# Patient Record
Sex: Female | Born: 1995
Health system: Southern US, Community
[De-identification: ages and names within clinical notes are randomized; demographics above are authoritative.]

## PROBLEM LIST (undated history)

## (undated) DIAGNOSIS — G8250 Quadriplegia, unspecified: Secondary | ICD-10-CM

## (undated) DIAGNOSIS — R569 Unspecified convulsions: Secondary | ICD-10-CM

## (undated) HISTORY — DX: Unspecified convulsions: R56.9

## (undated) HISTORY — PX: MYRINGOTOMY WITH TUBE PLACEMENT: SHX5663

---
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
## 2016-07-01 ENCOUNTER — Telehealth: Payer: Self-pay

## 2016-07-01 DIAGNOSIS — G40309 Generalized idiopathic epilepsy and epileptic syndromes, not intractable, without status epilepticus: Secondary | ICD-10-CM

## 2016-07-01 MED ORDER — CARBATROL 200 MG PO CP12: 200.0000 mg | ORAL_CAPSULE | Freq: Every evening | ORAL | Status: DC

## 2016-07-01 NOTE — Telephone Encounter (Signed)
Signed elecronically.

## 2016-07-01 NOTE — Telephone Encounter (Signed)
[facility] sent over a refill request for Carbatrol 200mg .  CB:[phone]

## 2016-07-05 ENCOUNTER — Other Ambulatory Visit: Payer: Self-pay | Admitting: Family

## 2016-07-05 DIAGNOSIS — G40309 Generalized idiopathic epilepsy and epileptic syndromes, not intractable, without status epilepticus: Secondary | ICD-10-CM

## 2016-07-05 MED ORDER — CARBATROL 300 MG PO CP12: 300.0000 mg | ORAL_CAPSULE | Freq: Every morning | ORAL | Status: DC

## 2016-08-29 ENCOUNTER — Other Ambulatory Visit: Payer: Self-pay | Admitting: Family

## 2016-11-01 ENCOUNTER — Other Ambulatory Visit (INDEPENDENT_AMBULATORY_CARE_PROVIDER_SITE_OTHER): Payer: Self-pay | Admitting: Family

## 2016-11-01 DIAGNOSIS — R451 Restlessness and agitation: Secondary | ICD-10-CM

## 2016-11-01 MED ORDER — CLONAZEPAM 2 MG PO TABS: ORAL_TABLET | ORAL | 1 refills | Status: DC

## 2017-01-06 ENCOUNTER — Other Ambulatory Visit (INDEPENDENT_AMBULATORY_CARE_PROVIDER_SITE_OTHER): Payer: Self-pay | Admitting: Family

## 2017-01-06 DIAGNOSIS — G40309 Generalized idiopathic epilepsy and epileptic syndromes, not intractable, without status epilepticus: Secondary | ICD-10-CM

## 2017-01-06 MED ORDER — CARBATROL 200 MG PO CP12: 200.0000 mg | ORAL_CAPSULE | Freq: Every evening | ORAL | 0 refills | Status: DC

## 2017-01-25 ENCOUNTER — Encounter (INDEPENDENT_AMBULATORY_CARE_PROVIDER_SITE_OTHER): Payer: Self-pay | Admitting: Family

## 2017-01-25 ENCOUNTER — Telehealth (INDEPENDENT_AMBULATORY_CARE_PROVIDER_SITE_OTHER): Payer: Self-pay

## 2017-01-25 DIAGNOSIS — G40309 Generalized idiopathic epilepsy and epileptic syndromes, not intractable, without status epilepticus: Secondary | ICD-10-CM

## 2017-01-25 MED ORDER — CARBATROL 300 MG PO CP12: 300.0000 mg | ORAL_CAPSULE | Freq: Every morning | ORAL | 0 refills | Status: DC

## 2017-01-25 NOTE — Telephone Encounter (Signed)
[facility] faxed over a refill request for Carbatrol 300mg    [phone]

## 2017-02-06 ENCOUNTER — Encounter (INDEPENDENT_AMBULATORY_CARE_PROVIDER_SITE_OTHER): Payer: Self-pay | Admitting: Family

## 2017-02-06 NOTE — Progress Notes (Signed)
Patient: Ebony JewelsJasmin Young MRN: [id number] Sex: female DOB: November 25, 1996  Provider: Elveria Risingina Zahki Hoogendoorn, NP Location of Care: [facility]  Note type: Routine return visit  History of Present Illness: Referral Source: Harrison MonsWilliam Brad Davis, MD [facility] and [facility] Chief Complaint: Generalized convulsive epilepsy  [name] is a 21 y.o. with history of seizures and quadriparesis. She was last seen December 17, 2015. Ebony Young has moderately severe cognitive impairment, spastic quadriparesis, and well controlled seizures that occurred as a result of the neonatal insult, which was presumed to be hypoxic ischemic. She is taking and tolerating Carbatrol for her seizure disorder. Roxie had an EEG on October 28, 2014 to determine if she could safely taper off Carbatrol since she had been seizure free for more than 5 years. The EEG was abnormal, with very low wattage beta range activity and rhythmic delta range activity that could be artifactual. No seizure activity was seen. The decision was made for her to continue taking Carbatrol.   Today [name]'s mother reports that she has remained seizure free since her last visit. She has been generally healthy. She has episodes of agitation at night, and Clonazepam helps when comfort measures fail to calm her. Gladyse has oral seeking behavior and wears arm splints to prevent her from getting her hands in her mouth. She manages to chew on the straps of the arm splints. 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   Her mother has no other health concerns for Ebony Young today other than previously mentioned.  Review of Systems: Please see the HPI for neurologic and other pertinent review of systems. Otherwise, the following systems are noncontributory including constitutional, eyes, ears, nose and throat, cardiovascular, respiratory, gastrointestinal,  genitourinary, musculoskeletal, skin, endocrine, hematologic/lymph, allergic/immunologic and psychiatric.   Past Medical History:  Diagnosis Date  . Seizures (HCC)    Hospitalizations: No., Head Injury: No., Nervous System Infections: No., Immunizations up to date: Yes.    Past Medical History Comments: The patient had seizures in infancy. They subsided only to recur in 546 months of age. Over time it was clear that the patient had left greater than right hemiparesis. She was profoundly cognitively impaired. Her seizures were relatively infrequent on the order of 1-2 per year. Her last seizure was October 2008. She has taken tolerated medications well.  The patient has problems with insomnia when she awakens she watches TV. She does not cry until she hears others awaken.  The patient has problems with weight gain. She has also had problems with self mutilatory behavior which has been solved by placing her arms and splints.  Her last MRI scan showed evidence of generalized atrophy and hydrocephalus ex vacuo. EEGs in the past showed diffuse slowing and right frontal spikes during sleep. She had an EEG July 03, 2006 that was an essentially normal record awake and drowsy. She had a repeat EEG on October 28, 2014 that was abnormal as mentioned. She has not had a polysomnogram.  Surgical History Past Surgical History:  Procedure Laterality Date  . MYRINGOTOMY WITH TUBE PLACEMENT      Family History family history includes Cancer in her paternal grandmother; Pneumonia in her paternal grandfather. Family History is otherwise negative for migraines, seizures, cognitive impairment, blindness, deafness, birth defects, chromosomal disorder, autism.  Social History Social History   Social History  . Marital status: Single    Spouse name: N/A  .  Number of children: N/A  . Years of education: N/A   Social History Main Topics  . Smoking status: Never Smoker  . Smokeless tobacco: Never Used  .  Alcohol use No  . Drug use: No  . Sexual activity: No   Other Topics Concern  . 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 She lives with her parents. She enjoys watching TV and car rides.    Allergies No Known Allergies  Physical Exam BP 110/70   Pulse 90   LMP 01/30/2017 (Within Days)       General: alert, short stature, well nourished, in no acute distress, non-handed       Head: microcephalic, no dysmorphic features       Ears, Nose and Throat: Otoscopic: tympanic membranes normal . Pharynx: Cannot see.      Neck: supple, full range of motion, no cranial or cervical bruits      Respiratory: auscultation clear      Cardiovascular: no murmurs, pulses are normal      Musculoskeletal: no skeletal deformities or apparent scoliosis      Skin: no rashes or neurocutaneous lesions, She has scars on her hands from previous self-inflicted injuries.She wears braces on her arms to keep her from biting them.       Neurologic Exam      Mental Status: alert; she is unable to cooperate and follow commands. She has no language. She laughs aloud at times.    Cranial Nerves: visual fields are full to double simultaneous stimuli; extraocular movements are full and dysconjugate; pupils are round reactive to light; funduscopic examination shows positive red reflex    bilaterally; symmetric with lower facial weakness ; midline tongue, I am unable to see the uvula; the patient turns to localize sound bilaterally.     Motor: spastic quadriparesis general strength in the 4 / 5 range, weaker on the left, with the right leg externally rotated.The patient immediately pulled at her clothing and put her fingers in her mouth when    splints were removed from her arms. She picked up objects of interest and then quickly threw them.     Sensory: Withdrawal x4     Coordination: unable to test     Gait and Station: The patient is wheelchair-bound. She is plegic. She can bear weight  on her legs and stands independently with support, but only for a few seconds. She is not able to walk independently. She was able to walk leaning forward holding onto her father, with a broad-based gait, dragging her feet.  Reflexes: symmetric and normal proximally, diminished distally bilaterally; no clonus; bilateral flexor plantar responses.   Impression  1. Generalized convulsive epilepsy  2. Congenital quadriplegia  3. Severe developmental delay  4. Microcephaly  5. Pervasive developmental disorder  6. Insomnia   Recommendations for plan of care The patient's previous [facility] records were reviewed. Zori has neither had nor required imaging or lab studies since the last visit. She is a 21 year old girl with history of generalized convulsive epilepsy in good control,congenital quadriplegia, microcephaly, pervasive developmental disorder, and insomnia. She has remained seizure free, but because of the abnormal EEG in November 2015, will remain on Carbatrol for now.  I will see her back in follow up in 1 year or sooner if needed. Her mother agreed with these plans.  The medication list was reviewed and reconciled.  No changes were made in the prescribed medications today.  A complete medication list was provided to the patient/caregiver.  Allergies as of 02/07/2017   No Known Allergies     Medication List       Accurate as of 02/07/17  4:04 PM. Always use your most recent med list.          CARBATROL 300 MG 12 hr capsule Generic drug:  carbamazepine Take 1 capsule (300 mg total) by mouth every morning.   CARBATROL 200 MG 12 hr capsule Generic drug:  carbamazepine Take 1 capsule (200 mg total) by mouth every evening.   clonazePAM 2 MG tablet Commonly known as:  KLONOPIN Give 1 tablet at bedtime as needed for agitation. May repeat 1/2 tablet in 2 hours if needed.   ranitidine 75 MG/5ML syrup Commonly known as:  ZANTAC       Dr. Sharene Skeans was consulted regarding the  patient.   Total time spent with the patient was 25 minutes, of which 50% or more was spent in counseling and coordination of care.   Elveria Rising NP-C

## 2017-02-07 ENCOUNTER — Ambulatory Visit (INDEPENDENT_AMBULATORY_CARE_PROVIDER_SITE_OTHER): Payer: Medicaid Other | Admitting: Family

## 2017-02-07 ENCOUNTER — Encounter (INDEPENDENT_AMBULATORY_CARE_PROVIDER_SITE_OTHER): Payer: Self-pay | Admitting: Family

## 2017-02-07 VITALS — BP 110/70 | HR 90

## 2017-02-07 DIAGNOSIS — R6250 Unspecified lack of expected normal physiological development in childhood: Secondary | ICD-10-CM

## 2017-02-07 DIAGNOSIS — Q02 Microcephaly: Secondary | ICD-10-CM | POA: Diagnosis not present

## 2017-02-07 DIAGNOSIS — G40309 Generalized idiopathic epilepsy and epileptic syndromes, not intractable, without status epilepticus: Secondary | ICD-10-CM

## 2017-02-07 DIAGNOSIS — G808 Other cerebral palsy: Secondary | ICD-10-CM

## 2017-02-07 DIAGNOSIS — R451 Restlessness and agitation: Secondary | ICD-10-CM

## 2017-02-07 MED ORDER — CARBATROL 200 MG PO CP12: 200.0000 mg | ORAL_CAPSULE | Freq: Every evening | ORAL | 5 refills | Status: DC

## 2017-02-07 MED ORDER — CARBATROL 300 MG PO CP12: 300.0000 mg | ORAL_CAPSULE | Freq: Every morning | ORAL | 5 refills | Status: DC

## 2017-02-07 MED ORDER — CLONAZEPAM 2 MG PO TABS: ORAL_TABLET | ORAL | 5 refills | Status: DC

## 2017-02-07 NOTE — Patient Instructions (Signed)
Continue Ebony Young's medications as you have been giving them. Let me know if she has any break through seizures.   Please sign up for MyChart - your online portal to Ebony Young's electronic medical record.   Please plan to return for follow up in 1 year or sooner if needed.

## 2017-02-21 ENCOUNTER — Other Ambulatory Visit (INDEPENDENT_AMBULATORY_CARE_PROVIDER_SITE_OTHER): Payer: Self-pay | Admitting: Family

## 2017-02-21 DIAGNOSIS — G40309 Generalized idiopathic epilepsy and epileptic syndromes, not intractable, without status epilepticus: Secondary | ICD-10-CM

## 2017-02-28 ENCOUNTER — Emergency Department (HOSPITAL_COMMUNITY)
Admission: EM | Admit: 2017-02-28 | Discharge: 2017-03-01 | Disposition: A | Discharge status: Home / Self Care | Payer: Managed Care, Other (non HMO) | Attending: Emergency Medicine | Admitting: Emergency Medicine

## 2017-02-28 ENCOUNTER — Encounter (HOSPITAL_COMMUNITY): Payer: Self-pay | Admitting: *Deleted

## 2017-02-28 DIAGNOSIS — Y999 Unspecified external cause status: Secondary | ICD-10-CM | POA: Insufficient documentation

## 2017-02-28 DIAGNOSIS — Y9389 Activity, other specified: Secondary | ICD-10-CM | POA: Diagnosis not present

## 2017-02-28 DIAGNOSIS — W06XXXA Fall from bed, initial encounter: Secondary | ICD-10-CM | POA: Insufficient documentation

## 2017-02-28 DIAGNOSIS — Y92008 Other place in unspecified non-institutional (private) residence as the place of occurrence of the external cause: Secondary | ICD-10-CM | POA: Insufficient documentation

## 2017-02-28 DIAGNOSIS — W19XXXA Unspecified fall, initial encounter: Secondary | ICD-10-CM

## 2017-02-28 DIAGNOSIS — S0003XA Contusion of scalp, initial encounter: Secondary | ICD-10-CM | POA: Insufficient documentation

## 2017-02-28 DIAGNOSIS — S0990XA Unspecified injury of head, initial encounter: Secondary | ICD-10-CM | POA: Diagnosis present

## 2017-02-28 HISTORY — DX: Quadriplegia, unspecified: G82.50

## 2017-02-28 MED ORDER — LORAZEPAM 2 MG/ML IJ SOLN
2.0000 mg | Freq: Once | INTRAMUSCULAR | Status: AC
  Administered 2017-02-28: 2 mg via INTRAMUSCULAR
  Filled 2017-02-28: qty 1

## 2017-02-28 NOTE — ED Triage Notes (Signed)
Pt is wheelchair bound and was bouncing at home, fell off the bed onto the floor. Hematoma noted to forehead

## 2017-02-28 NOTE — ED Triage Notes (Signed)
Family requesting CT scan of Head.

## 2017-02-28 NOTE — ED Notes (Signed)
Pt called x 2 with no answer  

## 2017-02-28 NOTE — ED Provider Notes (Signed)
[facility]-EMERGENCY DEPT Provider Note   CSN: [id number] Arrival date & time: 02/28/17  2022  By signing my name below, I, Majel Homereyton Lee, attest that this documentation has been prepared under the direction and in the presence of Terance HartKelly Gekas, PA-C . Electronically Signed: Majel HomerPeyton Lee, Scribe. 02/28/2017. 10:26 PM.  History   Chief Complaint Chief Complaint  Patient presents with  . Fall   The history is provided by a parent. No language interpreter was used.   HPI Comments: Ebony Young is a 21 y.o. female with PMHx of quadriparesis and seizures, who presents to the Emergency Department for an evaluation s/p a fall that occurred at ~11:00 AM this morning. Per mom, "pt likes to bounce" and was bouncing on her bed this evening when she suddenly fell off the bed, causing her to strike her head on the floor. She states she was getting ready to give pt a bath this evening when she noticed a large "bump" to her central forehead that was "soft, not hard," which is why she brought her to the ED. Pt's mom denies any recent changes in pt's activity, bleeding anywhere, recent seizure-like activity, and use of anticoagulant medication.   Past Medical History:  Diagnosis Date  . Quadriparesis (HCC)   . Seizures [facility](HCC)    Patient Active Problem List   Diagnosis Date Noted  . Agitation 12/17/2015  . Generalized convulsive epilepsy (HCC) 06/13/2013  . Congenital quadriplegia (HCC) 06/13/2013  . Microcephalus (HCC) 06/13/2013  . Severe developmental delay 06/13/2013   Past Surgical History:  Procedure Laterality Date  . MYRINGOTOMY WITH TUBE PLACEMENT     OB History    No data available     Home Medications    Prior to Admission medications   Medication Sig Start Date End Date Taking? Authorizing Provider  CARBATROL 200 MG 12 hr capsule Take 1 capsule (200 mg total) by mouth every evening. 02/07/17   Elveria Risingina Goodpasture, NP  CARBATROL 300 MG 12 hr capsule TAKE ONE CAPSULE BY MOUTH EVERY DAY 02/21/17    Elveria Risingina Goodpasture, NP  clonazePAM (KLONOPIN) 2 MG tablet Give 1 tablet at bedtime as needed for agitation. May repeat 1/2 tablet in 2 hours if needed. 02/07/17   Elveria Risingina Goodpasture, NP  ranitidine (ZANTAC) 75 MG/5ML syrup  05/28/13   Historical Provider, MD   Family History Family History  Problem Relation Age of Onset  . Cancer Paternal Grandmother     Died at 7260  . Pneumonia Paternal Grandfather     Died at 2476   Social History Social History  Substance Use Topics  . Smoking status: Never Smoker  . Smokeless tobacco: Never Used  . Alcohol use No   Allergies   Patient has no known allergies.  Review of Systems Review of Systems  Constitutional: Negative for fever.  Skin:       +bump to her forehead   Physical Exam Updated Vital Signs BP 136/87 (BP Location: Left Arm)   Pulse 118   Temp 98.5 F (36.9 C) (Axillary)   Resp 20   Ht 4\' 9"  (1.448 m)   Wt 105 lb (47.6 kg)   LMP 01/30/2017 (Within Days)   SpO2 100%   BMI 22.72 kg/m   Physical Exam  Constitutional: She is oriented to person, place, and time. She appears well-developed and well-nourished. No distress.  Non-verbal at baseline  HENT:  Head: Normocephalic.  Medium sized hematoma over center of forehead. No palpable skull fracture.   Eyes: EOM are normal.  Neck: Normal range of motion.  Cardiovascular: Tachycardia present.   Pulmonary/Chest: Effort normal.  Abdominal: She exhibits no distension.  Musculoskeletal: Normal range of motion.  Neurological: She is alert and oriented to person, place, and time.  Psychiatric: She has a normal mood and affect.  Nursing note and vitals reviewed.  ED Treatments / Results  DIAGNOSTIC STUDIES:  Oxygen Saturation is 100% on RA, normal by my interpretation.    COORDINATION OF CARE:  10:17 PM Discussed treatment plan with pt at bedside and pt agreed to plan.  Labs (all labs ordered are listed, but only abnormal results are displayed) Labs Reviewed - No data to  display  EKG  EKG Interpretation None       Radiology No results found.  Procedures Procedures (including critical care time)  Medications Ordered in ED Medications - No data to display  Initial Impression / Assessment and Plan / ED Course  I have reviewed the triage vital signs and the nursing notes.  Pertinent labs & imaging results that were available during my care of the patient were reviewed by me and considered in my medical decision making (see chart for details).  21 year old female with head injury after a fall. She is tachycardic but otherwise vitals are normal. She is acting normally per parents. Discussed observation vs CT scan. Due to inability to get history from patient and parents preference, CT was ordered. 2mg  Ativan IM ordered for sedation per parents request. Pt was put on cardiac monitor.   CT scan is negative. Advised ice/heat as needed and hematoma will resolve on it's own. Advised follow up with PCP. Opportunity for questions provided and all questions answered. Return precautions given.  I personally performed the services described in this documentation, which was scribed in my presence. The recorded information has been reviewed and is accurate.   Final Clinical Impressions(s) / ED Diagnoses   Final diagnoses:  Fall, initial encounter  Scalp hematoma, initial encounter    New Prescriptions Discharge Medication List as of 03/01/2017  1:33 AM       Bethel Born, PA-C 03/03/17 1623    Geoffery Lyons, MD 03/05/17 [phone]

## 2017-02-28 NOTE — ED Triage Notes (Signed)
Family reports Pt was bouncing on bed and fell off bed . Pt hit her head and has a soft raised area. Family reports there are no changes with Pt . Pt base line is non-verbal .

## 2017-03-01 ENCOUNTER — Emergency Department (HOSPITAL_COMMUNITY): Payer: Managed Care, Other (non HMO)

## 2017-03-01 DIAGNOSIS — S0003XA Contusion of scalp, initial encounter: Secondary | ICD-10-CM | POA: Diagnosis not present

## 2017-03-01 NOTE — ED Notes (Signed)
This RN transporting patient to CT scan with pt's Father. Pt is on 5 lead, pulse ox and blood pressure. VSS. Pt is not anxious. Pt cooperative of procedure.

## 2017-03-01 NOTE — Discharge Instructions (Signed)
Apply cold compress to area tomorrow several times a day Apply heat the day after until hematoma resolves Make appointment with primary doctor for close follow up

## 2017-03-01 NOTE — ED Notes (Signed)
Pt back in room with this RN and pt's parents.

## 2017-03-01 NOTE — ED Notes (Addendum)
Pt's parents verbalized understanding of d/c instructions and has no further questions. Pt appears to be in NAD. Pt tachycardic upon d/c at 127, Provider notified. Pt wheeled to car by father. Pt alert and oriented to norm per parents.

## 2017-08-17 ENCOUNTER — Other Ambulatory Visit (INDEPENDENT_AMBULATORY_CARE_PROVIDER_SITE_OTHER): Payer: Self-pay | Admitting: Family

## 2017-08-17 DIAGNOSIS — G40309 Generalized idiopathic epilepsy and epileptic syndromes, not intractable, without status epilepticus: Secondary | ICD-10-CM

## 2017-08-18 ENCOUNTER — Other Ambulatory Visit (INDEPENDENT_AMBULATORY_CARE_PROVIDER_SITE_OTHER): Payer: Self-pay | Admitting: Family

## 2017-08-18 DIAGNOSIS — G40309 Generalized idiopathic epilepsy and epileptic syndromes, not intractable, without status epilepticus: Secondary | ICD-10-CM

## 2017-08-18 MED ORDER — CARBATROL 300 MG PO CP12: ORAL_CAPSULE | ORAL | 5 refills | Status: DC

## 2017-08-18 MED ORDER — CARBATROL 200 MG PO CP12: 200.0000 mg | ORAL_CAPSULE | Freq: Every evening | ORAL | 5 refills | Status: DC

## 2017-08-26 ENCOUNTER — Other Ambulatory Visit (INDEPENDENT_AMBULATORY_CARE_PROVIDER_SITE_OTHER): Payer: Self-pay | Admitting: Family

## 2017-08-26 DIAGNOSIS — G40309 Generalized idiopathic epilepsy and epileptic syndromes, not intractable, without status epilepticus: Secondary | ICD-10-CM

## 2017-08-28 ENCOUNTER — Other Ambulatory Visit (INDEPENDENT_AMBULATORY_CARE_PROVIDER_SITE_OTHER): Payer: Self-pay | Admitting: Family

## 2017-08-28 DIAGNOSIS — G40309 Generalized idiopathic epilepsy and epileptic syndromes, not intractable, without status epilepticus: Secondary | ICD-10-CM

## 2017-08-28 MED ORDER — CARBATROL 200 MG PO CP12: 200.0000 mg | ORAL_CAPSULE | Freq: Every evening | ORAL | 5 refills | Status: DC

## 2017-09-21 ENCOUNTER — Other Ambulatory Visit (INDEPENDENT_AMBULATORY_CARE_PROVIDER_SITE_OTHER): Payer: Self-pay | Admitting: Family

## 2017-09-21 DIAGNOSIS — R451 Restlessness and agitation: Secondary | ICD-10-CM

## 2018-02-20 ENCOUNTER — Encounter (HOSPITAL_COMMUNITY): Payer: Self-pay | Admitting: Emergency Medicine

## 2018-02-20 ENCOUNTER — Emergency Department (HOSPITAL_COMMUNITY): Payer: Managed Care, Other (non HMO)

## 2018-02-20 ENCOUNTER — Emergency Department (HOSPITAL_COMMUNITY)
Admission: EM | Admit: 2018-02-20 | Discharge: 2018-02-21 | Disposition: A | Discharge status: Home / Self Care | Payer: Managed Care, Other (non HMO) | Attending: Emergency Medicine | Admitting: Emergency Medicine

## 2018-02-20 ENCOUNTER — Other Ambulatory Visit: Payer: Self-pay

## 2018-02-20 DIAGNOSIS — G8250 Quadriplegia, unspecified: Secondary | ICD-10-CM | POA: Diagnosis not present

## 2018-02-20 DIAGNOSIS — K5900 Constipation, unspecified: Secondary | ICD-10-CM | POA: Insufficient documentation

## 2018-02-20 DIAGNOSIS — R05 Cough: Secondary | ICD-10-CM | POA: Diagnosis not present

## 2018-02-20 DIAGNOSIS — F88 Other disorders of psychological development: Secondary | ICD-10-CM | POA: Diagnosis not present

## 2018-02-20 DIAGNOSIS — Z79899 Other long term (current) drug therapy: Secondary | ICD-10-CM | POA: Diagnosis not present

## 2018-02-20 LAB — COMPREHENSIVE METABOLIC PANEL
ALT: 13 U/L — ABNORMAL LOW (ref 14–54)
AST: 20 U/L (ref 15–41)
Albumin: 4 g/dL (ref 3.5–5.0)
Alkaline Phosphatase: 59 U/L (ref 38–126)
Anion gap: 13 (ref 5–15)
BUN: <5 mg/dL — ABNORMAL LOW (ref 6–20)
CO2: 21 mmol/L — ABNORMAL LOW (ref 22–32)
Calcium: 9.3 mg/dL (ref 8.9–10.3)
Chloride: 102 mmol/L (ref 101–111)
Creatinine, Ser: 0.52 mg/dL (ref 0.44–1.00)
GFR calc Af Amer: >60 mL/min (ref >60)
GFR calc non Af Amer: >60 mL/min (ref >60)
Glucose, Bld: 83 mg/dL (ref 65–99)
Potassium: 4.3 mmol/L (ref 3.5–5.1)
Sodium: 136 mmol/L (ref 135–145)
Total Bilirubin: 0.5 mg/dL (ref 0.3–1.2)
Total Protein: 7.2 g/dL (ref 6.5–8.1)

## 2018-02-20 LAB — CBC WITH DIFFERENTIAL/PLATELET
Basophils Absolute: 0 10*3/uL (ref 0.0–0.1)
Basophils Relative: 0 %
Eosinophils Absolute: 0.1 10*3/uL (ref 0.0–0.7)
Eosinophils Relative: 1 %
HCT: 38.2 % (ref 36.0–46.0)
Hemoglobin: 12.5 g/dL (ref 12.0–15.0)
Lymphocytes Relative: 30 %
Lymphs Abs: 3.1 10*3/uL (ref 0.7–4.0)
MCH: 30.1 pg (ref 26.0–34.0)
MCHC: 32.7 g/dL (ref 30.0–36.0)
MCV: 92 fL (ref 78.0–100.0)
Monocytes Absolute: 0.9 10*3/uL (ref 0.1–1.0)
Monocytes Relative: 9 %
Neutro Abs: 6.4 10*3/uL (ref 1.7–7.7)
Neutrophils Relative %: 60 %
Platelets: 349 10*3/uL (ref 150–400)
RBC: 4.15 MIL/uL (ref 3.87–5.11)
RDW: 17.1 % — ABNORMAL HIGH (ref 11.5–15.5)
WBC: 10.6 10*3/uL — ABNORMAL HIGH (ref 4.0–10.5)

## 2018-02-20 LAB — I-STAT CHEM 8, ED
CREATININE: 0.5 mg/dL (ref 0.44–1.00)
Calcium, Ion: 1.03 mmol/L — ABNORMAL LOW (ref 1.15–1.40)
Chloride: 104 mmol/L (ref 101–111)
GLUCOSE: 90 mg/dL (ref 65–99)
HCT: 42 % (ref 36.0–46.0)
HEMOGLOBIN: 14.3 g/dL (ref 12.0–15.0)
Potassium: 4.2 mmol/L (ref 3.5–5.1)
Sodium: 137 mmol/L (ref 135–145)
TCO2: 26 mmol/L (ref 22–32)

## 2018-02-20 LAB — POC URINE PREG, ED: Preg Test, Ur: NEGATIVE

## 2018-02-20 LAB — URINALYSIS, ROUTINE W REFLEX MICROSCOPIC
Bilirubin Urine: NEGATIVE
Glucose, UA: NEGATIVE mg/dL
Hgb urine dipstick: NEGATIVE
Ketones, ur: NEGATIVE mg/dL
Leukocytes, UA: NEGATIVE
Nitrite: NEGATIVE
Protein, ur: NEGATIVE mg/dL
Specific Gravity, Urine: 1.011 (ref 1.005–1.030)
pH: 6 (ref 5.0–8.0)

## 2018-02-20 LAB — LIPASE, BLOOD: Lipase: 27 U/L (ref 11–51)

## 2018-02-20 MED ORDER — FLEET ENEMA 7-19 GM/118ML RE ENEM
1.0000 | ENEMA | Freq: Once | RECTAL | Status: AC
  Administered 2018-02-21: 1 via RECTAL
  Filled 2018-02-20: qty 1

## 2018-02-20 MED ORDER — LORAZEPAM 2 MG/ML IJ SOLN
1.0000 mg | Freq: Once | INTRAMUSCULAR | Status: AC
  Administered 2018-02-20: 1 mg via INTRAVENOUS
  Filled 2018-02-20: qty 1

## 2018-02-20 MED ORDER — IOPAMIDOL (ISOVUE-300) INJECTION 61%
INTRAVENOUS | Status: AC
  Administered 2018-02-20: 100 mL
  Filled 2018-02-20: qty 100

## 2018-02-20 MED ORDER — LORAZEPAM 2 MG/ML IJ SOLN
0.5000 mg | Freq: Once | INTRAMUSCULAR | Status: AC
  Administered 2018-02-21: 0.5 mg via INTRAVENOUS
  Filled 2018-02-20: qty 1

## 2018-02-20 MED ORDER — SODIUM CHLORIDE 0.9 % IV BOLUS (SEPSIS): 1000.0000 mL | Freq: Once | INTRAVENOUS | Status: DC

## 2018-02-20 MED ORDER — POLYETHYLENE GLYCOL 3350 17 G PO PACK: PACK | ORAL | 0 refills | Status: AC

## 2018-02-20 MED ORDER — LORAZEPAM 2 MG/ML IJ SOLN
INTRAMUSCULAR | Status: AC
  Administered 2018-02-20: 23:00:00
  Filled 2018-02-20: qty 1

## 2018-02-20 NOTE — ED Notes (Signed)
Pt updated on delay in progress. EDP also notified.

## 2018-02-20 NOTE — ED Notes (Signed)
Pt is nonverbal but alert. Father at bedside to assist with information relay to staff

## 2018-02-20 NOTE — ED Triage Notes (Signed)
Pt arrives via POV from home, obvious global developmental delays, concern for no BM since Thursday. Seen by PMD yesterday and diagnosed with UTI. Per Dad has been increasingly fussy, agitated.

## 2018-02-20 NOTE — ED Notes (Addendum)
IV attempted x2 on Left hand and AC. EDP notified of delay.

## 2018-02-20 NOTE — Discharge Instructions (Signed)
Start taking MiraLAX in addition to stool softener.  Mix at least 1 capful with 8 ounces of water daily.  Try to eat foods that are high in fiber.  Follow-up with primary care physician for reevaluation of symptoms.  Return to the emergency department immediately for any concerning signs or symptoms develop such as fever, abdominal distention, vomiting after meals, or blood in the urine or stool.

## 2018-02-20 NOTE — ED Notes (Signed)
Pt is special needs. Pt rocking back and forth in chair. Pulse O2 continuously falling off. Unable to get O2, HR & temp.

## 2018-02-20 NOTE — ED Provider Notes (Signed)
[facility] EMERGENCY DEPARTMENT Provider Note   CSN: [id number] Arrival date & time: 02/20/18  1218     History   Chief Complaint Chief Complaint  Patient presents with  . Constipation  . Urinary Tract Infection   Level 5 caveat due to patient nonverbal HPI Ebony Young is a 22 y.o. female with history of quadriparesis, generalized convulsive epilepsy, microcephalus and severe developmental delays presents accompanied by father with complaint of constipation for at least 6 days.  Patient's father states that she has not had a bowel movement since Thursday, perhaps longer.  She has had stool softener and Dulcolax over-the-counter without relief of her symptoms.  He also notes somewhat decreased urine production.  They provided a urine sample to her primary care physician yesterday who diagnosed her with a UTI and prescribed 250 mg of Keflex twice daily.  Father states that her appetite has been normal and denies any vomiting.  No fevers.  He does note a "mucousy sounding cough "but denies observed shortness of breath.  He states that she does not ever localized pain but that she has been increasingly fussy over the past several days.  He denies any hematuria, melena, or hematochezia.  No noted rectal bleeding. No history of prior abdominal surgeries.   The history is provided by a parent.    Past Medical History:  Diagnosis Date  . Quadriparesis (HCC)   . Seizures [facility])     Patient Active Problem List   Diagnosis Date Noted  . Agitation 12/17/2015  . Generalized convulsive epilepsy (HCC) 06/13/2013  . Congenital quadriplegia (HCC) 06/13/2013  . Microcephalus (HCC) 06/13/2013  . Severe developmental delay 06/13/2013    Past Surgical History:  Procedure Laterality Date  . MYRINGOTOMY WITH TUBE PLACEMENT      OB History    No data available       Home Medications    Prior to Admission medications   Medication Sig Start Date End Date Taking?  Authorizing Provider  CARBATROL 200 MG 12 hr capsule Take 1 capsule (200 mg total) by mouth every evening. 08/28/17  Yes Elveria Rising, NP  CARBATROL 300 MG 12 hr capsule Take 1 capsule by mouth every morning 08/18/17  Yes Goodpasture, Inetta Fermo, NP  cephALEXin (KEFLEX) 250 MG capsule Take 250 mg by mouth 2 (two) times daily. For seven days 02/19/18  Yes [provider]  clonazePAM (KLONOPIN) 2 MG tablet TAKE 1 TABLET BY MOUTH AT BEDTIME AS NEEDED FOR AGITATION. MAY REPEAT ONE-HALF TABLET IN 2 HOURS IF NEEDED. 09/22/17  Yes Elveria Rising, NP  polyethylene glycol (MIRALAX / GLYCOLAX) packet Mix one capful in 8oz of fluid daily 02/20/18   Jeanie Sewer, PA-C    Family History Family History  Problem Relation Age of Onset  . Cancer Paternal Grandmother        Died at 66  . Pneumonia Paternal Grandfather        Died at 63    Social History Social History   Tobacco Use  . Smoking status: Never Smoker  . Smokeless tobacco: Never Used  Substance Use Topics  . Alcohol use: No  . Drug use: No     Allergies   Patient has no known allergies.   Review of Systems Review of Systems  Unable to perform ROS: Patient nonverbal  Respiratory: Positive for cough.   Gastrointestinal: Positive for constipation.  Genitourinary: Negative for hematuria.     Physical Exam Updated Vital Signs BP (!) 130/94  Pulse (!) 109   Temp 99.2 F (37.3 C) (Oral)   Resp 18   Ht 4\' 6"  (1.372 m)   Wt 46.3 kg (102 lb)   LMP 01/23/2018 (Approximate) Comment: Dad unsure of exact date  SpO2 100%   BMI 24.59 kg/m   Physical Exam  Constitutional: She appears well-developed and well-nourished. No distress.  Patient sitting upright in bed, rocking back and forth frequently making grunting sounds.   HENT:  Head: Normocephalic and atraumatic.  Eyes: Conjunctivae are normal. Right eye exhibits no discharge. Left eye exhibits no discharge.  Neck: No JVD present. No tracheal deviation present.    Cardiovascular: Regular rhythm.  Mildly tachycardic  Pulmonary/Chest: Effort normal.  Scattered rhonchi noted.  Equal rise and fall of chest, no increased work of breathing.  Abdominal: Soft. She exhibits no distension.  Limited examination due to patient's mental status  Musculoskeletal: She exhibits no edema.  Moves extremities spontaneously with limited range of motion, atrophic extremities noted  Neurological: She is alert.  Skin: Skin is warm and dry. No erythema.  Psychiatric: She has a normal mood and affect. Her behavior is normal.  Nursing note and vitals reviewed.    ED Treatments / Results  Labs (all labs ordered are listed, but only abnormal results are displayed) Labs Reviewed  COMPREHENSIVE METABOLIC PANEL - Abnormal; Notable for the following components:      Result Value   CO2 21 (*)    BUN <5 (*)    ALT 13 (*)    All other components within normal limits  CBC WITH DIFFERENTIAL/PLATELET - Abnormal; Notable for the following components:   WBC 10.6 (*)    RDW 17.1 (*)    All other components within normal limits  I-STAT CHEM 8, ED - Abnormal; Notable for the following components:   BUN <3 (*)    Calcium, Ion 1.03 (*)    All other components within normal limits  URINE CULTURE  LIPASE, BLOOD  URINALYSIS, ROUTINE W REFLEX MICROSCOPIC  POC URINE PREG, ED    EKG  EKG Interpretation None       Radiology Ct Abdomen Pelvis W Contrast  Result Date: 02/20/2018 CLINICAL DATA:  Initial evaluation for constipation. EXAM: CT ABDOMEN AND PELVIS WITH CONTRAST TECHNIQUE: Multidetector CT imaging of the abdomen and pelvis was performed using the standard protocol following bolus administration of intravenous contrast. CONTRAST:  ISOVUE-300 IOPAMIDOL (ISOVUE-300) INJECTION 61% COMPARISON:  Prior radiograph from earlier the same day. FINDINGS: Lower chest: Visualized lung bases are clear. Hepatobiliary: Liver demonstrates a normal contrast enhanced appearance.  Gallbladder normal. No biliary dilatation. Pancreas: Pancreas within normal limits. Spleen: Spleen within normal limits. Adrenals/Urinary Tract: Adrenal glands are normal. Kidneys equal in size with symmetric enhancement. No nephrolithiasis, hydronephrosis, or focal enhancing renal mass. No appreciable hydroureter. Bladder compressed anteriorly but within normal limits without acute abnormality. Stomach/Bowel: Visualized esophagus somewhat patulous without acute abnormality. Stomach within normal limits. No evidence for small bowel obstruction. There is severe fecal impaction within the rectal vault which is dilated up to 9.8 cm in diameter. Colon is dilated proximally with gas and stool throughout the colonic lumen. No other significant inflammatory changes about the bowels. No findings to suggest appendicitis. Vascular/Lymphatic: Normal intravascular enhancement seen throughout the visualized aorta. Mesenteric vessels patent proximally. No adenopathy. Reproductive: Uterus mildly displaced anteriorly from the enlarged rectal vault. Uterus and ovaries otherwise unremarkable. No adnexal mass. Probable right ovarian corpus luteal cyst noted. Other: No free air or fluid. Musculoskeletal: No  acute osseous abnormality. No worrisome lytic or blastic osseous lesions. Thoracolumbar scoliosis noted. IMPRESSION: 1. Severe fecal impaction with marked dilatation of the rectal vault as above, consistent with constipation. Colon is diffusely dilated containing gas and stool proximally. 2. No other acute intra-abdominal or pelvic process. Electronically Signed   By: Rise MuBenjamin  McClintock M.D.   On: 02/20/2018 23:25   Dg Abdomen Acute W/chest  Result Date: 02/20/2018 CLINICAL DATA:  Constipation. EXAM: DG ABDOMEN ACUTE W/ 1V CHEST COMPARISON:  Chest x-ray dated March 03, 2010. Abdominal x-ray dated November 26, 2007. FINDINGS: The cardiomediastinal silhouette is normal in size. Normal pulmonary vascularity. No focal consolidation,  pleural effusion, or pneumothorax. No acute osseous abnormality. There is marked gaseous distention of the transverse colon and splenic flexure, as well as the stomach. Large stool burden throughout the remaining visualized colon. No free intraperitoneal air. IMPRESSION: 1. Gastric and transverse colonic gaseous distention. Large stool burden throughout the remaining colon. Electronically Signed   By: Obie DredgeWilliam T Derry M.D.   On: 02/20/2018 17:33    Procedures Procedures (including critical care time)  Medications Ordered in ED Medications  sodium chloride 0.9 % bolus 1,000 mL (0 mLs Intravenous Hold 02/20/18 2056)  LORazepam (ATIVAN) injection 2 mg (2 mg Intravenous Not Given 02/20/18 2255)  LORazepam (ATIVAN) injection 1 mg (1 mg Intravenous Given 02/20/18 2209)  iopamidol (ISOVUE-300) 61 % injection (100 mLs  Contrast Given 02/20/18 2237)  LORazepam (ATIVAN) 2 MG/ML injection (  Given 02/20/18 2255)  sodium phosphate (FLEET) 7-19 GM/118ML enema 1 enema (1 enema Rectal Given 02/21/18 0031)  LORazepam (ATIVAN) injection 0.5 mg (0.5 mg Intravenous Given 02/21/18 0024)     Initial Impression / Assessment and Plan / ED Course  I have reviewed the triage vital signs and the nursing notes.  Pertinent labs & imaging results that were available during my care of the patient were reviewed by me and considered in my medical decision making (see chart for details).     Patient presents accompanied by father with complaint of constipation and possible UTI.  Afebrile, mildly tachycardic while in the ED but this appears to be her baseline based on chart review.  Remainder of lab work is at patient's baseline as well.  She is nontoxic in appearance.  Abdomen is soft and she does not grimace on palpation.  Active bowel sounds in all 4 quadrants.  Radiographs show gastric and transverse colonic gaseous distention and a large stool burden throughout the colon so we will proceed with CT of the abdomen and pelvis to rule out  obstruction.  Lab work shows very mild nonspecific leukocytosis, no electrolyte abnormalities or elevation in creatinine or LFTs.  Lipase normal.  UA does not suggest UTI, sent for culture.  Patient did require sedation with Ativan for catheterization to obtain urine as well as to obtain CT scan which she tolerated well.  No respiratory depression.CT shows severe fecal impaction with marked dilatation of the rectal vault.  Colon is diffusely dilated containing gas and stool.  No small bowel obstruction.  No other acute surgical abdominal pathology identified.  Discussed findings with patient's father who elects to have enema in the ED.  Patient also required sedation for this which she tolerated without difficulty.  Discussed that UA is not concerning for UTI and patient's father states that he will discontinue antibiotic.  Will discharge with MiraLAX and recommend follow-up with primary care physician for reevaluation of symptoms.  Discussed indications for return to the ED.  Patient's  father verbalizes understanding of and agreement with plan and patient stable for discharge home at this time.  Final Clinical Impressions(s) / ED Diagnoses   Final diagnoses:  Constipation, unspecified constipation type    ED Discharge Orders        Ordered    polyethylene glycol (MIRALAX / GLYCOLAX) packet     02/20/18 2344       Jeanie Sewer, PA-C 02/21/18 0117    Tegeler, Canary Brim, MD 02/21/18 [phone]

## 2018-02-20 NOTE — ED Notes (Signed)
Patient transported to X-ray 

## 2018-02-21 DIAGNOSIS — K5900 Constipation, unspecified: Secondary | ICD-10-CM | POA: Diagnosis not present

## 2018-02-21 MED ORDER — LORAZEPAM 2 MG/ML IJ SOLN: 2.0000 mg | Freq: Once | INTRAMUSCULAR | Status: DC

## 2018-02-21 NOTE — ED Notes (Signed)
 2mg  Ativan verbal order from EDP for patient safety while pt was in CT. Medication pulled from pyxis via override due to order not populating in pyxis at time of medication removal. RN updated EDP Tegeler and RN Sue LushAndrea.

## 2018-02-22 LAB — URINE CULTURE: Culture: NO GROWTH

## 2018-02-26 ENCOUNTER — Telehealth (INDEPENDENT_AMBULATORY_CARE_PROVIDER_SITE_OTHER): Payer: Self-pay | Admitting: Family

## 2018-02-26 DIAGNOSIS — G40309 Generalized idiopathic epilepsy and epileptic syndromes, not intractable, without status epilepticus: Secondary | ICD-10-CM

## 2018-02-26 DIAGNOSIS — R451 Restlessness and agitation: Secondary | ICD-10-CM

## 2018-02-26 MED ORDER — CARBATROL 200 MG PO CP12: 200.0000 mg | ORAL_CAPSULE | Freq: Every evening | ORAL | 5 refills | Status: DC

## 2018-02-26 MED ORDER — CARBATROL 300 MG PO CP12: ORAL_CAPSULE | ORAL | 5 refills | Status: DC

## 2018-02-26 MED ORDER — CLONAZEPAM 2 MG PO TABS: ORAL_TABLET | ORAL | 0 refills | Status: DC

## 2018-02-26 NOTE — Telephone Encounter (Signed)
Rx has been faxed to the pharmacy 

## 2018-02-26 NOTE — Telephone Encounter (Signed)
°  Who's calling (name and relationship to patient) : Hal HopeJoretta, mother (legal guardian) Best contact number: [phone] Provider they see: Elveria Risingina Goodpasture Reason for call:     PRESCRIPTION REFILL ONLY  Name of prescription: Carbatrol 200 and 300mg , and Klonopin   [facility]

## 2018-02-26 NOTE — Telephone Encounter (Signed)
Rx has been printed and placed on Dr. Hickling's desk 

## 2018-03-15 ENCOUNTER — Ambulatory Visit (INDEPENDENT_AMBULATORY_CARE_PROVIDER_SITE_OTHER): Payer: Managed Care, Other (non HMO) | Admitting: Family

## 2018-03-15 ENCOUNTER — Encounter (INDEPENDENT_AMBULATORY_CARE_PROVIDER_SITE_OTHER): Payer: Self-pay | Admitting: Family

## 2018-03-15 VITALS — BP 98/70 | HR 80 | Ht 60.0 in | Wt 102.0 lb

## 2018-03-15 DIAGNOSIS — Q02 Microcephaly: Secondary | ICD-10-CM

## 2018-03-15 DIAGNOSIS — G808 Other cerebral palsy: Secondary | ICD-10-CM

## 2018-03-15 DIAGNOSIS — G40309 Generalized idiopathic epilepsy and epileptic syndromes, not intractable, without status epilepticus: Secondary | ICD-10-CM

## 2018-03-15 DIAGNOSIS — R451 Restlessness and agitation: Secondary | ICD-10-CM | POA: Diagnosis not present

## 2018-03-15 DIAGNOSIS — R6250 Unspecified lack of expected normal physiological development in childhood: Secondary | ICD-10-CM

## 2018-03-15 MED ORDER — CARBATROL 200 MG PO CP12: 200.0000 mg | ORAL_CAPSULE | Freq: Every evening | ORAL | 5 refills | Status: DC

## 2018-03-15 MED ORDER — CARBATROL 300 MG PO CP12: ORAL_CAPSULE | ORAL | 5 refills | Status: DC

## 2018-03-15 MED ORDER — CLONAZEPAM 2 MG PO TABS: ORAL_TABLET | ORAL | 5 refills | Status: DC

## 2018-03-15 NOTE — Patient Instructions (Signed)
Thank you for coming in today.   Instructions for you until your next appointment are as follows: 1. [facility] will [facility] Apr [facility], [facility]. Please arrive at 1st floor admitting at 10:45AM to register.  2. Dr Sharene SkeansHickling will read the EEG and either he or I will call with the results.  3. I have refilled the Carbatrol. Continue to give that as you have been doing.  4. Let me know if [name] has any seizures or if you have any other concerns. 5. I will see Ebony Young back in follow up in 1 year or sooner if needed.

## 2018-03-15 NOTE — Progress Notes (Signed)
Patient: Ebony Young MRN: [id number] Sex: female DOB: 1996-07-12  Provider: Elveria Rising, NP Location of Care: [facility]  Note type: Routine return visit  History of Present Illness: Referral Source: Harrison Mons, MD History from: both parents and [facility] chart Chief Complaint: Epilepsy  [name] is a 22 y.o. 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 She was last seen February 07, 2017. [name] is taking and tolerating Carbatrol for her seizure disorder and has remained seizure free for many years. An EEG was performed in November 2015 to determine if she could safely taper off medication. The result was abnormal with very low wattage beta range activity and rhythmic delta range activity that could be artifactual. The decision was made at the time to continue her medication. Her parents would like to repeat the EEG again to see if she can taper off medication.   [name]'s parents report today that she has been generally healthy since she was last seen. She was seen in the ER once for constipation but otherwise has been doing well. She has episodes of agitation at night and Clonazepam helps when all other measures fail to calm her. [name] tends to bite her hands when she can get them to her mouth so her parents keep her hands restrained or her arms in splints, which limits her access to biting herself. Her parents have no other health concerns for [name] today other than previously mentioned.  Review of Systems: Please see the HPI for neurologic and other pertinent review of systems. Otherwise, all other systems were reviewed and were negative.    Past Medical History:  Diagnosis Date  . Quadriparesis (HCC)   . Seizures (HCC)    Hospitalizations: No., Head Injury: No., Nervous System Infections: No., Immunizations up to date: Yes.   Past  Medical History Comments: The patient had seizures in infancy. They subsided only to recur in 102 months of age. Over time it was clear that the patient had left greater than right hemiparesis. She was profoundly cognitively impaired. Her seizures were relatively infrequent on the order of 1-2 per year. Her last seizure was October 2008. She has taken tolerated medications well.  The patient has problems with insomnia when she awakens she watches TV. She does not cry until she hears others awaken.  The patient has problems with weight gain. She has also had problems with self mutilatory behavior which has been solved by placing her arms and splints.  Her last MRI scan showed evidence of generalized atrophy and hydrocephalus ex vacuo. EEGs in the past showed diffuse slowing and right frontal spikes during sleep. She had an EEG July 03, 2006 that was an essentially normal record awake and drowsy. She had a repeat EEG on October 28, 2014 that was abnormal as mentioned. She has not had a polysomnogram.   Surgical History Past Surgical History:  Procedure Laterality Date  . MYRINGOTOMY WITH TUBE PLACEMENT      Family History family history includes Cancer in her paternal grandmother; Pneumonia in her paternal grandfather. Family History is otherwise negative for migraines, seizures, cognitive impairment, blindness, deafness, birth defects, chromosomal disorder, autism.  Social History Social History   Socioeconomic History  . Marital status: Single    Spouse name: Not on file  . Number of children: Not on file  . Years of education: Not on file  . Highest education  level: Not on file  Occupational History  . Not on file  Social Needs  . Financial resource strain: Not on file  . Food insecurity:    Worry: Not on file    Inability: Not on file  . Transportation needs:    Medical: Not on file    Non-medical: Not on file  Tobacco Use  . Smoking status: Never Smoker  . Smokeless tobacco:  Never Used  Substance and Sexual Activity  . Alcohol use: No  . Drug use: No  . Sexual activity: Never  Lifestyle  . Physical activity:    Days per week: Not on file    Minutes per session: Not on file  . Stress: Not on file  Relationships  . Social connections:    Talks on phone: Not on file    Gets together: Not on file    Attends religious service: Not on file    Active member of club or organization: Not on file    Attends meetings of clubs or organizations: Not on file    Relationship status: Not on file  Other Topics Concern  . 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 She lives with her parents. She enjoys watching TV and car rides.    Allergies No Known Allergies  Physical Exam BP 98/70   Pulse 80   Ht 5' (1.524 m)   Wt 102 lb (46.3 kg)   BMI 19.92 kg/m  General: short statured woman, seated in wheelchair with arms restrained, in no evident distress; black hair, brown eyes, non-handed.  Head: microcephalic and atraumatic. Oropharynx benign. It was difficult to see her pharynx due to her inability to cooperate. No dysmorphic features. Neck: supple with no carotid bruits. Cardiovascular: regular rate and rhythm, no murmurs. Respiratory: Clear to auscultation bilaterally Abdomen: Bowel sounds present all four quadrants, abdomen soft, non-tender, non-distended. No hepatosplenomegaly or masses palpated. Musculoskeletal: No skeletal deformities or obvious scoliosis. Her upper extremities are in soft restraints. The right leg is externally rotated. Skin: no rashes or neurocutaneous lesions. She has scars on her hands from previous self inflicted biting.   Neurologic Exam Mental Status: Awake and fully alert. Has no language.  Smiles at times. Unable to follow commands. Resistant to invasions in to her space. She was rocking her body back and forth during the entire visit. When her restraints were loosened, she immediately put her fingers in  her mouth. When not allowed to do so, she pulled at any available clothing or object. Cranial Nerves: Fundoscopic exam - red reflex present.  Unable to fully visualize fundus.  Pupils equal briskly reactive to light.  Eye movements are dysconjugate. Turns to localize faces and objects in the periphery. Turns to localize sounds in the periphery..  Neck flexion and extension normal. Has lower facial weakness with drooling. Motor: Spastic quadriparesis Sensory: Withdrawal x 4 Coordination: Unable to adequately assess due to patient's inability to participate in examination. No dysmetria when reaching for objects. Gait and Station: Unable to independently stand and bear weight. Able to stand with assistance but needs constant support.  Reflexes: Diminished and symmetric. Toes neutral. No clonus  Impression 1.  Generalized convulsive epilepsy 2.  Congenital spastic quadriparesis 3.  Severe developmental delay 4.  Microcephaly 5.  Pervasive developmental disorder 6.  Agitation 7.  Self stimulatory behavior, including biting her hands.   Recommendations for plan of care [facility]. Jermesha has neither  had nor required imaging or lab studies since the last visit. She is a 22 year old young woman with history of generalized seizures, spastic quadriparesis and severe developmental delay. She is taking and tolerating Carbatrol for her seizure disorder and has remained seizure free for several years. Her parents want to perform an EEG to see if she can safely taper off medication and I agreed to order the study. I will call her parents when the results are available to me. Otherwise I will see Silena back in follow up in 1 year or sooner if needed.   The medication list was reviewed and reconciled.  No changes were made in the prescribed medications today.  A complete medication list was provided to the patient's mother.  Allergies as of 03/15/2018   No Known  Allergies     Medication List        Accurate as of 03/15/18 11:56 AM. Always use your most recent med list.          CARBATROL 200 MG 12 hr capsule Generic drug:  carbamazepine Take 1 capsule (200 mg total) by mouth every evening.   CARBATROL 300 MG 12 hr capsule Generic drug:  carbamazepine Take 1 capsule by mouth every morning   cephALEXin 250 MG capsule Commonly known as:  KEFLEX Take 250 mg by mouth 2 (two) times daily. For seven days   clonazePAM 2 MG tablet Commonly known as:  KLONOPIN TAKE 1 TABLET BY MOUTH AT BEDTIME AS NEEDED FOR AGITATION. MAY REPEAT ONE-HALF TABLET IN 2 HOURS IF NEEDED.   polyethylene glycol packet Commonly known as:  MIRALAX / GLYCOLAX Mix one capful in 8oz of fluid daily       Dr. Sharene Skeans was consulted regarding the patient.   Total time spent with the patient was 20 minutes, of which 50% or more was spent in counseling and coordination of care.   Elveria Rising NP-C

## 2018-03-16 ENCOUNTER — Encounter (INDEPENDENT_AMBULATORY_CARE_PROVIDER_SITE_OTHER): Payer: Self-pay | Admitting: Family

## 2018-03-19 ENCOUNTER — Ambulatory Visit (INDEPENDENT_AMBULATORY_CARE_PROVIDER_SITE_OTHER): Payer: Managed Care, Other (non HMO) | Admitting: Family

## 2018-04-27 ENCOUNTER — Inpatient Hospital Stay (HOSPITAL_COMMUNITY): Admission: RE | Admit: 2018-04-27 | Payer: Managed Care, Other (non HMO) | Source: Ambulatory Visit

## 2018-05-22 ENCOUNTER — Ambulatory Visit (HOSPITAL_COMMUNITY): Payer: Managed Care, Other (non HMO)

## 2018-08-09 ENCOUNTER — Other Ambulatory Visit: Payer: Self-pay | Admitting: Pediatrics

## 2018-08-09 DIAGNOSIS — R451 Restlessness and agitation: Secondary | ICD-10-CM

## 2018-09-10 ENCOUNTER — Other Ambulatory Visit: Payer: Self-pay | Admitting: Pediatrics

## 2018-09-10 DIAGNOSIS — G40309 Generalized idiopathic epilepsy and epileptic syndromes, not intractable, without status epilepticus: Secondary | ICD-10-CM

## 2018-09-11 ENCOUNTER — Emergency Department (HOSPITAL_COMMUNITY)
Admission: EM | Admit: 2018-09-11 | Discharge: 2018-09-12 | Disposition: A | Discharge status: Home / Self Care | Payer: Managed Care, Other (non HMO) | Attending: Emergency Medicine | Admitting: Emergency Medicine

## 2018-09-11 ENCOUNTER — Other Ambulatory Visit: Payer: Self-pay

## 2018-09-11 ENCOUNTER — Emergency Department (HOSPITAL_COMMUNITY): Payer: Managed Care, Other (non HMO)

## 2018-09-11 ENCOUNTER — Encounter (HOSPITAL_COMMUNITY): Payer: Self-pay | Admitting: Emergency Medicine

## 2018-09-11 DIAGNOSIS — Z79899 Other long term (current) drug therapy: Secondary | ICD-10-CM | POA: Insufficient documentation

## 2018-09-11 DIAGNOSIS — Q02 Microcephaly: Secondary | ICD-10-CM | POA: Diagnosis not present

## 2018-09-11 DIAGNOSIS — M7989 Other specified soft tissue disorders: Secondary | ICD-10-CM | POA: Diagnosis not present

## 2018-09-11 DIAGNOSIS — R2241 Localized swelling, mass and lump, right lower limb: Secondary | ICD-10-CM | POA: Diagnosis present

## 2018-09-11 DIAGNOSIS — G8252 Quadriplegia, C1-C4 incomplete: Secondary | ICD-10-CM | POA: Insufficient documentation

## 2018-09-11 NOTE — ED Notes (Addendum)
Patient moving a lot when attempting to obtain vital signs. Unable to obtain blood pressure on either arm- Obtained on leg.

## 2018-09-11 NOTE — ED Provider Notes (Signed)
[facility]-EMERGENCY DEPT Provider Note   CSN: [id number] Arrival date & time: 09/11/18  2022     History   Chief Complaint No chief complaint on file.   HPI Daizee Pfefferle is a 22 y.o. female.  HPI Patient has a history of severe developmental delay.  Her parents brought her in because they noticed some swelling of her leg from her knee to her foot today.  Patient is nonverbal so they are not sure if she may have injured it.  Otherwise she seems to be acting normally.  No fevers or chills.  No redness or bruising noted on her extremities. Past Medical History:  Diagnosis Date  . Quadriparesis (HCC)   . Seizures [facility])     Patient Active Problem List   Diagnosis Date Noted  . Agitation 12/17/2015  . Generalized convulsive epilepsy (HCC) 06/13/2013  . Congenital quadriplegia (HCC) 06/13/2013  . Microcephalus (HCC) 06/13/2013  . Severe developmental delay 06/13/2013    Past Surgical History:  Procedure Laterality Date  . MYRINGOTOMY WITH TUBE PLACEMENT       OB History   None      Home Medications    Prior to Admission medications   Medication Sig Start Date End Date Taking? Authorizing Provider  CARBATROL 200 MG 12 hr capsule TAKE ONE CAPSULE EVERY EVENING 09/11/18   Elveria Rising, NP  CARBATROL 300 MG 12 hr capsule TAKE ONE CAPSULE EVERY MORNING 09/11/18   Elveria Rising, NP  cephALEXin (KEFLEX) 250 MG capsule Take 250 mg by mouth 2 (two) times daily. For seven days 02/19/18   [provider]  clonazePAM (KLONOPIN) 2 MG tablet TAKE 1 TABLET AT BEDTIME FOR AGITATION,MAY REPEAT 1/2 TABLET IN 2 HOURS IF NEEDED 08/09/18   Elveria Rising, NP  polyethylene glycol (MIRALAX / GLYCOLAX) packet Mix one capful in 8oz of fluid daily 02/20/18   Jeanie Sewer, PA-C    Family History Family History  Problem Relation Age of Onset  . Cancer Paternal Grandmother        Died at 65  . Pneumonia Paternal Grandfather        Died at 67     Social History Social History   Tobacco Use  . Smoking status: Never Smoker  . Smokeless tobacco: Never Used  Substance Use Topics  . Alcohol use: No  . Drug use: No     Allergies   Patient has no known allergies.   Review of Systems Review of Systems  All other systems reviewed and are negative.    Physical Exam Updated Vital Signs BP (!) 166/113 (BP Location: Right Leg) Comment: Pt moving  Pulse (!) 104   Temp 98.7 F (37.1 C) (Axillary)   Resp (!) 22   Ht 1.524 m (5')   Wt 40.8 kg   LMP 09/10/2018   SpO2 95%   BMI 17.58 kg/m   Physical Exam  Constitutional: No distress.  HENT:  Head: Normocephalic and atraumatic.  Right Ear: External ear normal.  Left Ear: External ear normal.  Eyes: Conjunctivae are normal. Right eye exhibits no discharge. Left eye exhibits no discharge. No scleral icterus.  Neck: Neck supple. No tracheal deviation present.  Cardiovascular: Normal rate.  Pulmonary/Chest: Effort normal. No stridor. No respiratory distress.  Abdominal: She exhibits no distension.  Musculoskeletal: She exhibits edema.  Mild edema right foot, ?small effusion right knee, no erythema, no induration  Neurological: She is alert. Cranial nerve deficit: no gross deficits.  Skin: Skin is  warm and dry. No rash noted.  Psychiatric: She has a normal mood and affect.  Nursing note and vitals reviewed.    ED Treatments / Results  Labs (all labs ordered are listed, but only abnormal results are displayed) Labs Reviewed - No data to display  EKG None  Radiology Dg Knee 2 Views Right  Result Date: 09/11/2018 CLINICAL DATA:  Nonverbal patient with right lower extremity pain and swelling. No known injury. EXAM: RIGHT KNEE - 1-2 VIEW COMPARISON:  None. FINDINGS: No evidence of fracture, dislocation, or joint effusion. No evidence of arthropathy or other focal bone abnormality. Soft tissues are unremarkable. IMPRESSION: Negative. Electronically Signed   By: Delbert PhenixJason  A Poff M.D.   On: 09/11/2018 23:58   Dg Tibia/fibula Right  Result Date: 09/11/2018 CLINICAL DATA:  Nonverbal patient with right lower extremity pain and swelling. No known injury. EXAM: RIGHT TIBIA AND FIBULA - 2 VIEW COMPARISON:  None. FINDINGS: No fracture. No suspicious focal osseous lesion. No radiopaque foreign body. Pes planus deformity. IMPRESSION: No fracture. Electronically Signed   By: Delbert PhenixJason A Poff M.D.   On: 09/11/2018 23:59   Dg Foot Complete Right  Result Date: 09/12/2018 CLINICAL DATA:  Nonverbal patient. Right lower extremity pain and swelling. No known injury. EXAM: RIGHT FOOT COMPLETE - 3+ VIEW COMPARISON:  None. FINDINGS: No fracture or dislocation. Pes planus deformity. No suspicious focal osseous lesion. No significant arthropathy. No radiopaque foreign body. IMPRESSION: No fracture or dislocation.  Pes planus deformity. Electronically Signed   By: Delbert PhenixJason A Poff M.D.   On: 09/12/2018 00:02    Procedures Procedures (including critical care time)  Medications Ordered in ED Medications - No data to display   Initial Impression / Assessment and Plan / ED Course  I have reviewed the triage vital signs and the nursing notes.  Pertinent labs & imaging results that were available during my care of the patient were reviewed by me and considered in my medical decision making (see chart for details).   X-rays without signs of injury.  No signs of infection on exam.  Sx not suggestive of DVT.  Discussed follow up with pcp later this week to be rechecked  Final Clinical Impressions(s) / ED Diagnoses   Final diagnoses:  Leg swelling    ED Discharge Orders    None       Linwood DibblesKnapp, Tatayana Beshears, MD 09/12/18 0021

## 2018-09-11 NOTE — ED Triage Notes (Signed)
Patient is nonverbal. Mom thinks she hit it but does not know for sure. Patient has swelling to right leg (from knee to her foot). Mom wants leg xray.

## 2018-09-12 NOTE — Discharge Instructions (Addendum)
Follow up with her doctor this week to be rechecked if the symptoms persist

## 2018-09-12 NOTE — ED Notes (Signed)
Topaz not working to obtain signature for discharge. Pt guardian verbalized signature. Sarah RN witnessed verbalization.

## 2018-12-21 DIAGNOSIS — Q02 Microcephaly: Secondary | ICD-10-CM | POA: Diagnosis not present

## 2018-12-21 DIAGNOSIS — E559 Vitamin D deficiency, unspecified: Secondary | ICD-10-CM | POA: Diagnosis not present

## 2018-12-21 DIAGNOSIS — K5909 Other constipation: Secondary | ICD-10-CM | POA: Diagnosis not present

## 2018-12-21 DIAGNOSIS — G808 Other cerebral palsy: Secondary | ICD-10-CM | POA: Diagnosis not present

## 2018-12-21 DIAGNOSIS — G40309 Generalized idiopathic epilepsy and epileptic syndromes, not intractable, without status epilepticus: Secondary | ICD-10-CM | POA: Diagnosis not present

## 2018-12-21 DIAGNOSIS — R6250 Unspecified lack of expected normal physiological development in childhood: Secondary | ICD-10-CM | POA: Diagnosis not present

## 2019-01-09 DIAGNOSIS — G809 Cerebral palsy, unspecified: Secondary | ICD-10-CM | POA: Diagnosis not present

## 2019-01-09 DIAGNOSIS — R32 Unspecified urinary incontinence: Secondary | ICD-10-CM | POA: Diagnosis not present

## 2019-01-31 DIAGNOSIS — G809 Cerebral palsy, unspecified: Secondary | ICD-10-CM | POA: Diagnosis not present

## 2019-01-31 DIAGNOSIS — R32 Unspecified urinary incontinence: Secondary | ICD-10-CM | POA: Diagnosis not present

## 2019-03-02 ENCOUNTER — Other Ambulatory Visit (INDEPENDENT_AMBULATORY_CARE_PROVIDER_SITE_OTHER): Payer: Self-pay | Admitting: Family

## 2019-03-02 DIAGNOSIS — R451 Restlessness and agitation: Secondary | ICD-10-CM

## 2019-03-04 DIAGNOSIS — R32 Unspecified urinary incontinence: Secondary | ICD-10-CM | POA: Diagnosis not present

## 2019-03-04 DIAGNOSIS — G809 Cerebral palsy, unspecified: Secondary | ICD-10-CM | POA: Diagnosis not present

## 2019-03-22 ENCOUNTER — Telehealth (INDEPENDENT_AMBULATORY_CARE_PROVIDER_SITE_OTHER): Payer: Self-pay | Admitting: Family

## 2019-03-22 NOTE — Telephone Encounter (Signed)
I left a voicemail at [phone] advising to call our office back and schedule a follow up with Inetta Fermo. Rufina Falco

## 2019-04-05 DIAGNOSIS — G809 Cerebral palsy, unspecified: Secondary | ICD-10-CM | POA: Diagnosis not present

## 2019-04-05 DIAGNOSIS — R339 Retention of urine, unspecified: Secondary | ICD-10-CM | POA: Diagnosis not present

## 2019-04-05 DIAGNOSIS — R32 Unspecified urinary incontinence: Secondary | ICD-10-CM | POA: Diagnosis not present

## 2019-04-19 ENCOUNTER — Telehealth (INDEPENDENT_AMBULATORY_CARE_PROVIDER_SITE_OTHER): Payer: Self-pay | Admitting: Pediatrics

## 2019-04-19 ENCOUNTER — Telehealth (INDEPENDENT_AMBULATORY_CARE_PROVIDER_SITE_OTHER): Payer: Self-pay | Admitting: Family

## 2019-04-19 NOTE — Telephone Encounter (Signed)
Who's calling (name and relationship to patient) : Fareeda Money (Mom)  Best contact number: [phone]  Provider they see: Dr. Sharene Skeans  Reason for call:  Mom called in stating that Jasmins old neuro office in [facility]) was to be faxing over a letter regarding hours that she was needing care for in home. Mom needs to know the status of this and if it has been completed. Please advise mom with a phone call back.   Call ID:      PRESCRIPTION REFILL ONLY  Name of prescription:  Pharmacy:

## 2019-04-19 NOTE — Telephone Encounter (Signed)
Error

## 2019-04-19 NOTE — Telephone Encounter (Signed)
Ebony Young, please look into this.

## 2019-04-22 NOTE — Telephone Encounter (Signed)
This was given to [place].  The patient needs to be seen inadvertently or in the office.  She needs to make that decision.  Asked mother to call back to set up an appointment and told her that we would not be able to fill out the forms until she did so.  I also asked her to discuss with Inetta Fermo whether an in office visit or a remote visit would be appropriate.

## 2019-04-22 NOTE — Telephone Encounter (Signed)
Appointment was scheduled for 5/5.  Mother is waiting to hear from our office regarding Tina's choice  if Kenzli needs to come in for this appt or be done telehealth.

## 2019-04-23 ENCOUNTER — Ambulatory Visit (INDEPENDENT_AMBULATORY_CARE_PROVIDER_SITE_OTHER): Payer: BLUE CROSS/BLUE SHIELD | Admitting: Family

## 2019-04-23 ENCOUNTER — Encounter (INDEPENDENT_AMBULATORY_CARE_PROVIDER_SITE_OTHER): Payer: Self-pay | Admitting: Family

## 2019-04-23 ENCOUNTER — Other Ambulatory Visit: Payer: Self-pay

## 2019-04-23 DIAGNOSIS — G40309 Generalized idiopathic epilepsy and epileptic syndromes, not intractable, without status epilepticus: Secondary | ICD-10-CM

## 2019-04-23 DIAGNOSIS — R6250 Unspecified lack of expected normal physiological development in childhood: Secondary | ICD-10-CM | POA: Diagnosis not present

## 2019-04-23 DIAGNOSIS — R451 Restlessness and agitation: Secondary | ICD-10-CM

## 2019-04-23 DIAGNOSIS — G808 Other cerebral palsy: Secondary | ICD-10-CM

## 2019-04-23 NOTE — Progress Notes (Signed)
This is a Pediatric Specialist E-Visit follow up consult provided via WebEx Cilia Piedra and their parent/guardian Hal Hope and Cariah Tobey consented to an E-Visit consult today.  Location of patient: Will is at home Location of provider: Elveria Rising, NP is at in office Patient was referred by Tisovec, Adelfa Koh, MD   The following participants were involved in this E-Visit: both parents, CMA, provider  Chief Complain/ Reason for E-Visit today: Seizures Total time on call: 15 min Follow up: one year     Haidee Hung   MRN:  [id number]  1995-12-23   Provider: Elveria Rising NP-C Location of Care: [facility]  Visit type: Routine visit  Last visit: 03/15/2018  Referral source: Harrison Mons, MD History from: mom and chcn chart  Brief history:  History of seizures, severe cognitive impairment and 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 She is taking and tolerating Carbatrol for seizures and has remained seizure free for many years. An EEG performed in 2015 determined that it would not be advisable for her to taper off medication. She has episodes of agitation and irritability, particularly the week prior to and at the onset of her menstrual cycle. She has longstanding history of biting her hands, and wears bilateral arm splints to prevent her from harming herself.    Today's concerns:  Ferrin's parents report today that she has been generally healthy since her last visit. She is unable to perform any activities of daily living on her own and requires a caregiver to attend to her needs at all times. At this time, her mother is her hired caregiver and performs the duties of a CNA. Dhani has had abnormal swallow studies in the past and requires thickener in liquids to prevent choking and aspiration. She can eat table food if it is is finely chopped and fed to her. She is unable to bathe herself or  perform toilet hygiene, and is unable to dress herself or walk. She has no language and is unable to communicate her wants and needs. She has episodes of agitation and irritability that her parents manage with distraction and occasional Clonazepam. They also use splints on her arms to prevent her from biting herself, and inspect her skin frequently for pressure areas.   Mom has no other health concerns for Scheryl today other than previously mentioned.   Review of systems: Please see HPI for neurologic and other pertinent review of systems. Otherwise all other systems were reviewed and were negative.  Problem List: Patient Active Problem List   Diagnosis Date Noted   Agitation 12/17/2015   Generalized convulsive epilepsy (HCC) 06/13/2013   Congenital quadriplegia (HCC) 06/13/2013   Microcephalus (HCC) 06/13/2013   Severe developmental delay 06/13/2013      Past Medical History:  Diagnosis Date   Quadriparesis (HCC)    Seizures (HCC)     Past medical history comments: See HPI Copied from previous record: The patient had seizures in infancy. They subsided only to recur in 23 months of age. Over time it was clear that the patient had left greater than right hemiparesis. She was profoundly cognitively impaired. Her seizures were relatively infrequent on the order of 1-2 per year. Her last seizure was October 2008. She has taken tolerated medications well.  The patient has problems with insomnia when she awakens she watches TV. She does not cry until she hears others awaken.  The patient has problems with weight gain. She has  also had problems with self mutilatory behavior which has been solved by placing her arms and splints.  Her last MRI scan showed evidence of generalized atrophy and hydrocephalus ex vacuo. EEGs in the past showed diffuse slowing and right frontal spikes during sleep. She had an EEG July 03, 2006 that was an essentially normal record awake and drowsy. She had a  repeat EEG on October 28, 2014 that was abnormal as mentioned. She has not had a polysomnogram.  Surgical history: Past Surgical History:  Procedure Laterality Date   MYRINGOTOMY WITH TUBE PLACEMENT       Family history: family history includes Cancer in her paternal grandmother; Pneumonia in her paternal grandfather.   Social history: Social History   Socioeconomic History   Marital status: Single    Spouse name: Not on file   Number of children: Not on file   Years of education: Not on file   Highest education level: Not on file  Occupational History   Not on file  Social Needs   Financial resource strain: Not on file   Food insecurity:    Worry: Not on file    Inability: Not on file   Transportation needs:    Medical: Not on file    Non-medical: Not on file  Tobacco Use   Smoking status: Never Smoker   Smokeless tobacco: Never Used  Substance and Sexual Activity   Alcohol use: No   Drug use: No   Sexual activity: Never  Lifestyle   Physical activity:    Days per week: Not on file    Minutes per session: Not on file   Stress: Not on file  Relationships   Social connections:    Talks on phone: Not on file    Gets together: Not on file    Attends religious service: Not on file    Active member of club or organization: Not on file    Attends meetings of clubs or organizations: Not on file    Relationship status: Not on file   Intimate partner violence:    Fear of current or ex partner: Not on file    Emotionally abused: Not on file    Physically abused: Not on file    Forced sexual activity: Not on file  Other Topics Concern   Not on file  Social History Narrative   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 She lives with her parents. She enjoys watching TV and car rides.    Allergies: No Known Allergies    Immunizations:  There is no immunization history on file for this patient.    Diagnostics/Screenings: rEEG 10/28/2014 - This is a  abnormal record with the patient awake.  The background shows very low voltage beta range activity and rhythmic delta range activity that could very well be artifactual.  There is no dominant frequency and no change in state of arousal throughout the record.  No seizure activity was seen. Ellison CarwinWilliam Hickling, MD   Physical Exam: There were no vitals taken for this visit.  General: well developed, well nourished obese young woman, seated in wheelchair, in no evident distress; black hair, brown eyes, non handed Head: microcephalic and atraumatic. No dysmorphic features. Neck: supple  Musculoskeletal: No skeletal deformities or obvious scoliosis. Wearing bilateral arm/elbow splints to prevent her from getting her hands to her mouth Skin: no rashes or neurocutaneous lesions  Neurologic Exam Mental Status: Awake and fully alert. Has no language.  Smiles at times but not always responsively.  Rocking back and forth at times. Unable to follow simple commands. Cranial Nerves: Turns to localize faces, objects and sounds in the periphery. Facial movements are symmetric, has lower facial weakness with drooling.  Neck flexion and extension normal. Motor: Spastic quadriparesis  Sensory: Unable to assess due to Webex format Coordination: Unable to adequately assess due to patient's inability to participate in examination. No dysmetria when reaching for objects. Gait and Station: Unable to independently stand and bear weight. Requires wheelchair for mobility  Impression: 1. Generalized convulsive epilepsy 2. Congenital spastic quadriparesis 3. Severe developmental delay 4. Microcephaly 5. Pervasive developmental disorder 6. Agitation 7. Self stimulatory behavior, including biting her hands   Recommendations for plan of care: The patient's previous [facility] records were reviewed. Naila has neither had nor required imaging or lab studies since the last visit. She is a 23 year old young woman with history of  generalized convulsive seizures, congenital spastic quadriparesis, severe developmental delay, microcephaly, pervasive developmental disorder, agitation, and self stimulatory behaviors. She is taking and tolerating Carbatrol for her seizure disorder and will remain on this medication for the foreseeable future. I will complete a form supplied by her parents to confirm the need for caregiver hours. I will see Jamye back in follow up in 1 year or sooner if needed. Mom agreed with the plans made today.   The medication list was reviewed and reconciled. No changes were made in the prescribed medications today. A complete medication list was provided to the patient.  Allergies as of 04/23/2019   No Known Allergies     Medication List       Accurate as of Apr 23, 2019 11:56 AM. Always use your most recent med list.        Carbatrol 300 MG 12 hr capsule Generic drug:  carbamazepine TAKE ONE CAPSULE EVERY MORNING   Carbatrol 200 MG 12 hr capsule Generic drug:  carbamazepine TAKE ONE CAPSULE EVERY EVENING   cephALEXin 250 MG capsule Commonly known as:  KEFLEX Take 250 mg by mouth 2 (two) times daily. For seven days   clonazePAM 2 MG tablet Commonly known as:  KLONOPIN TAKE 1 TABLET BY MOUTH AT BEDTIME FOR AGITATION. MAY REPEAT 0.5 TABLET IN 2 HOURS IF NEEDED   famotidine 20 MG tablet Commonly known as:  PEPCID Take 20 mg by mouth daily.   polyethylene glycol 17 g packet Commonly known as:  MIRALAX / GLYCOLAX Mix one capful in 8oz of fluid daily       I consulted with Dr Sharene Skeans regarding this patient.  Total time spent with the patient was 15 minutes, of which 50% or more was spent in counseling and coordination of care.  Elveria Rising NP-C [facility] Ph. [phone] Fax [phone]

## 2019-04-23 NOTE — Patient Instructions (Signed)
Thank you for meeting with me by Webex today.   Instructions for you until your next appointment are as follows: 1. I will complete the form for the caregiver hours and fax it today.  2. Continue giving Attie's medications as you have been doing.  3. Let me know if she has any seizures or if you have any other concerns.  4. Please sign up for MyChart if you have not done so 5. Please plan to return for follow up in one year or sooner if needed.

## 2019-05-08 DIAGNOSIS — G809 Cerebral palsy, unspecified: Secondary | ICD-10-CM | POA: Diagnosis not present

## 2019-05-08 DIAGNOSIS — R339 Retention of urine, unspecified: Secondary | ICD-10-CM | POA: Diagnosis not present

## 2019-06-06 DIAGNOSIS — R339 Retention of urine, unspecified: Secondary | ICD-10-CM | POA: Diagnosis not present

## 2019-06-06 DIAGNOSIS — G809 Cerebral palsy, unspecified: Secondary | ICD-10-CM | POA: Diagnosis not present

## 2019-07-05 DIAGNOSIS — R32 Unspecified urinary incontinence: Secondary | ICD-10-CM | POA: Diagnosis not present

## 2019-07-05 DIAGNOSIS — G809 Cerebral palsy, unspecified: Secondary | ICD-10-CM | POA: Diagnosis not present

## 2019-08-05 DIAGNOSIS — R32 Unspecified urinary incontinence: Secondary | ICD-10-CM | POA: Diagnosis not present

## 2019-08-05 DIAGNOSIS — G809 Cerebral palsy, unspecified: Secondary | ICD-10-CM | POA: Diagnosis not present

## 2019-08-08 IMAGING — CT CT ABD-PELV W/ CM
2 of 4 series · 15 of 46 positions shown, 17 images · IV contrast (Omni 300)
Comparison: Prior radiograph from earlier the same day.

CLINICAL DATA: Initial evaluation for constipation.

EXAM:
CT ABDOMEN AND PELVIS WITH CONTRAST
TECHNIQUE: Multidetector CT imaging of the abdomen and pelvis was performed
using the standard protocol following bolus administration of
intravenous contrast.
CONTRAST:  100mL G2DKI2-6RR IOPAMIDOL (G2DKI2-6RR) INJECTION 61%

[Series 3: a/p w/ 5mm · axial · 0.66mm/px · z∈[-197,+243]mm · 12 of 98 slices shown, 14 images]
[im 5/98  soft-tissue]
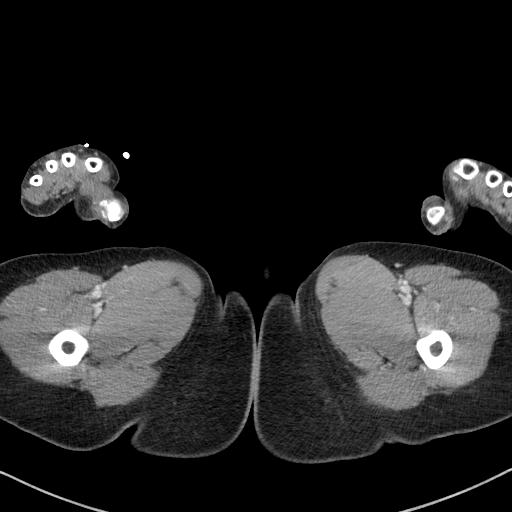
[im 5/98  bone]
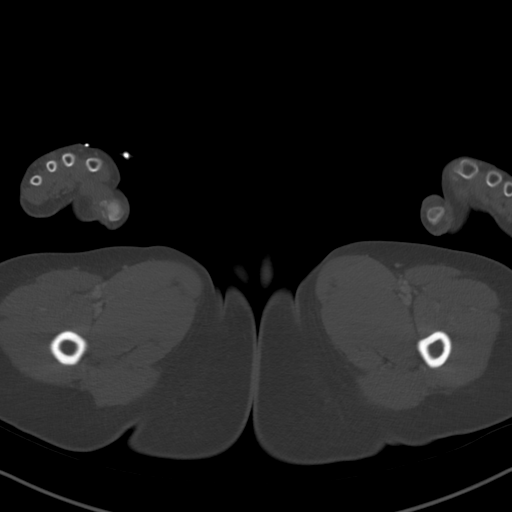
[im 13/98  soft-tissue]
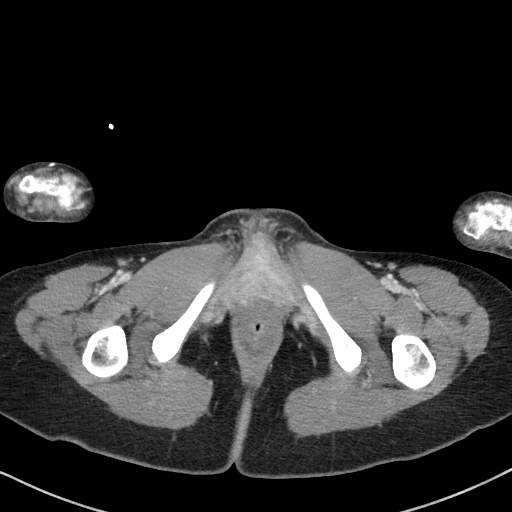
[im 21/98  soft-tissue]
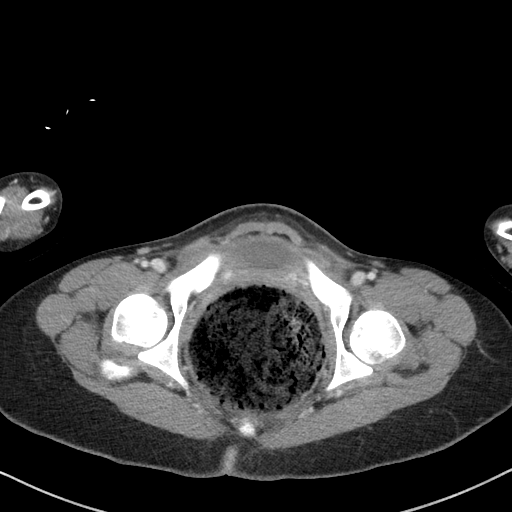
[im 29/98  soft-tissue]
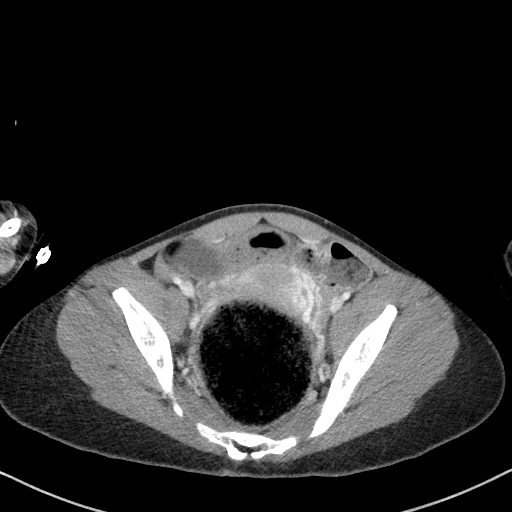
[im 37/98  soft-tissue]
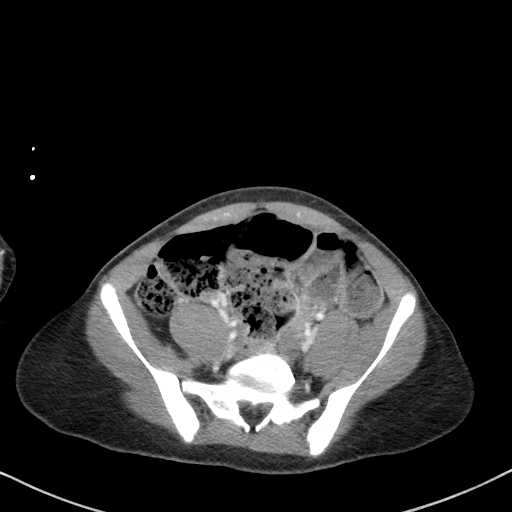
[im 45/98  soft-tissue]
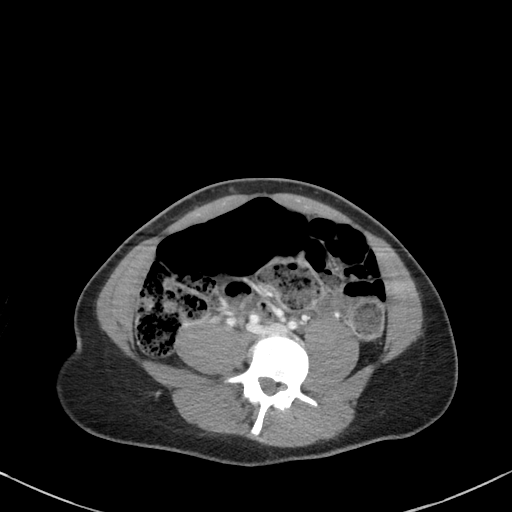
[im 53/98  soft-tissue]
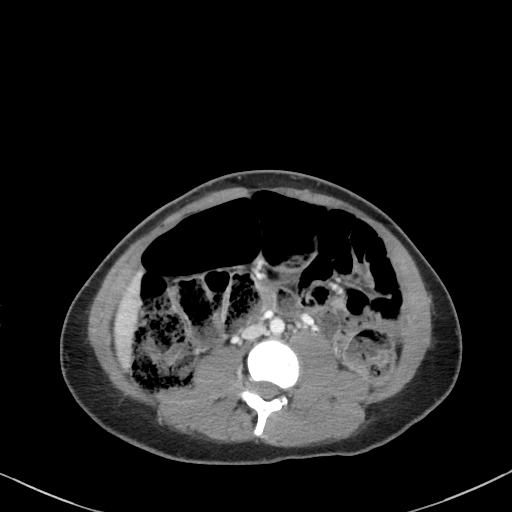
[im 61/98  soft-tissue]
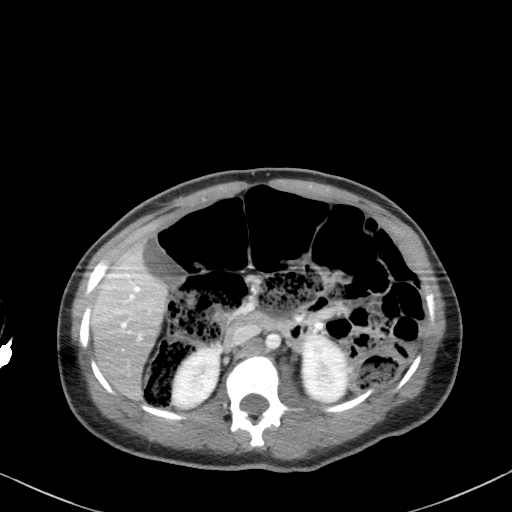
[im 69/98  soft-tissue]
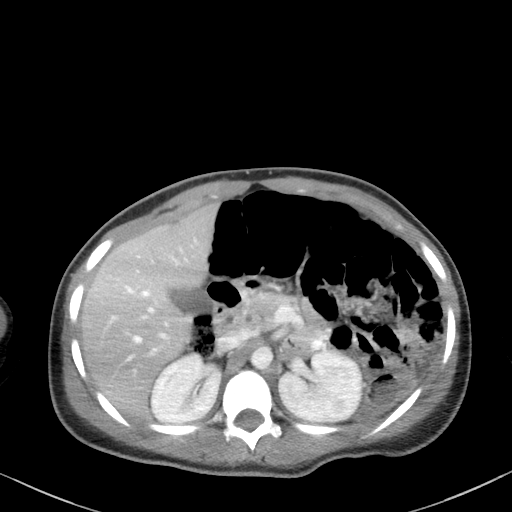
[im 69/98  bone]
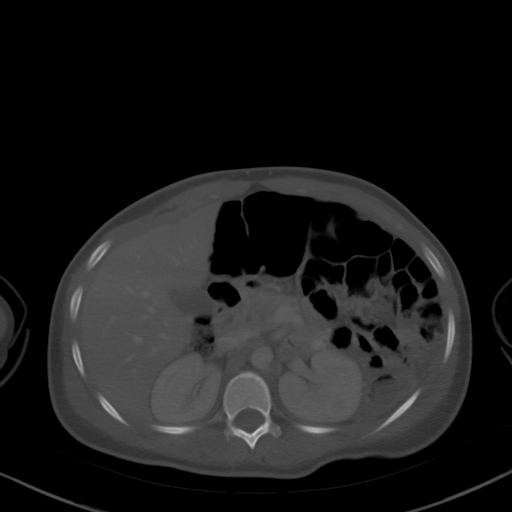
[im 77/98  soft-tissue]
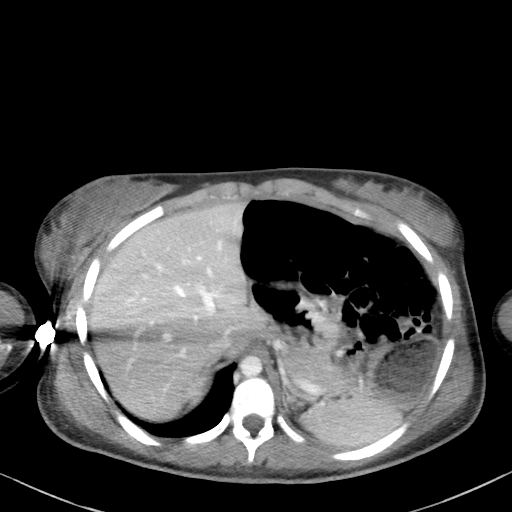
[im 85/98  soft-tissue]
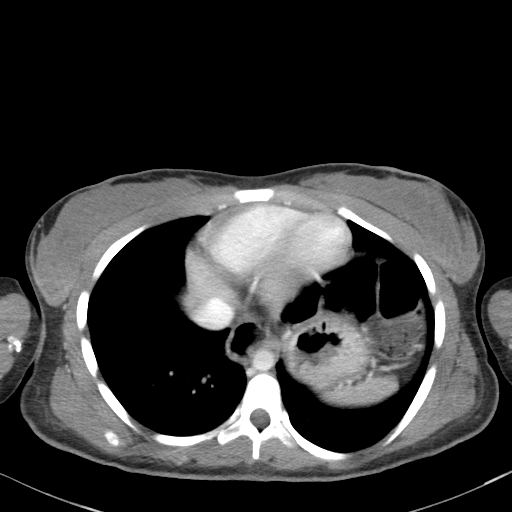
[im 93/98  soft-tissue]
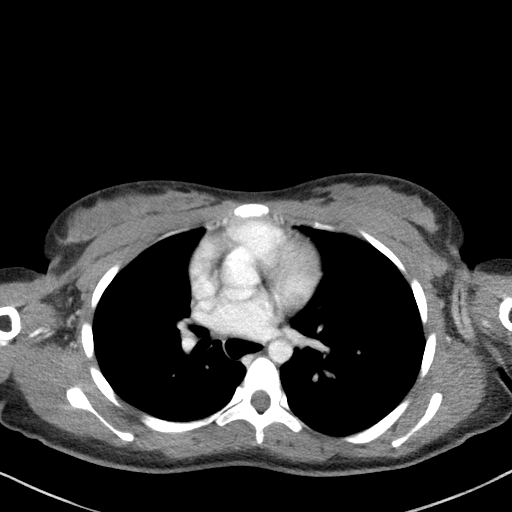

[Series 6: a/p w/ cor · coronal · 0.64mm/px · 3 of 107 slices shown]
[im 36/107  soft-tissue]
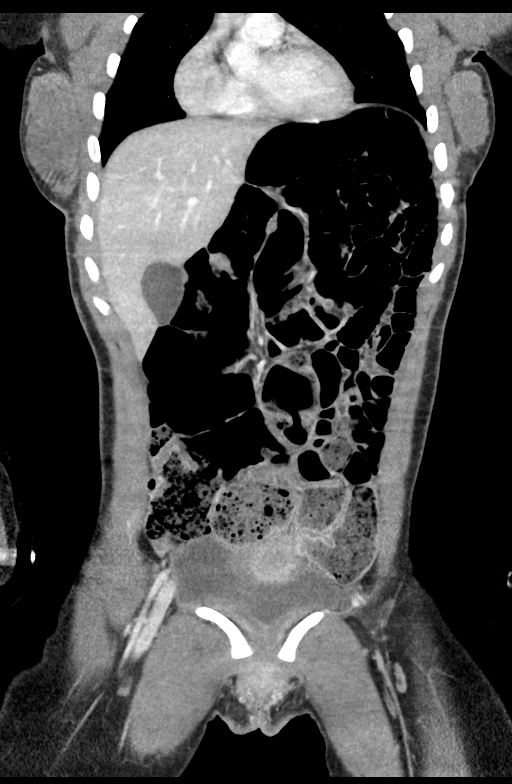
[im 48/107  soft-tissue]
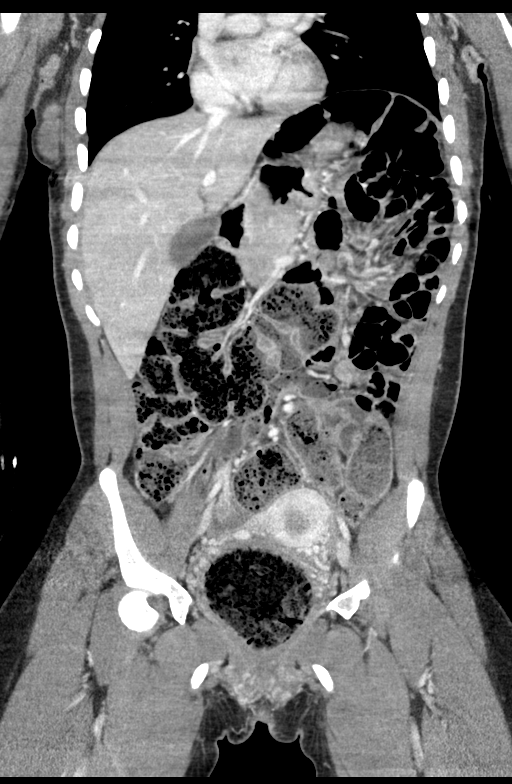
[im 59/107  soft-tissue]
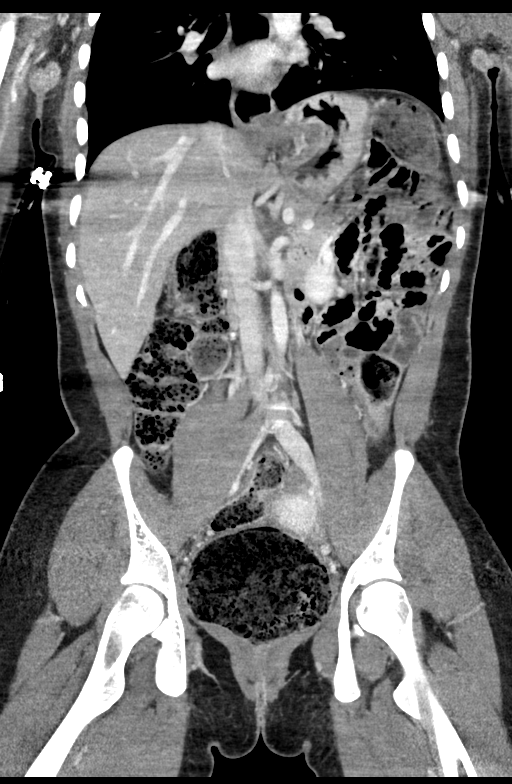

[15 of 46 positions shown; findings below may reference images not displayed]

FINDINGS: Lower chest: Visualized lung bases are clear.

Hepatobiliary: Liver demonstrates a normal contrast enhanced
appearance. Gallbladder normal. No biliary dilatation.

Pancreas: Pancreas within normal limits.

Spleen: Spleen within normal limits.

Adrenals/Urinary Tract: Adrenal glands are normal. Kidneys equal in
size with symmetric enhancement. No nephrolithiasis, hydronephrosis,
or focal enhancing renal mass. No appreciable hydroureter. Bladder
compressed anteriorly but within normal limits without acute
abnormality.

Stomach/Bowel: Visualized esophagus somewhat patulous without acute
abnormality. Stomach within normal limits. No evidence for small
bowel obstruction. There is severe fecal impaction within the rectal
vault which is dilated up to 9.8 cm in diameter. Colon is dilated
proximally with gas and stool throughout the colonic lumen. No other
significant inflammatory changes about the bowels. No findings to
suggest appendicitis.

Vascular/Lymphatic: Normal intravascular enhancement seen throughout
the visualized aorta. Mesenteric vessels patent proximally. No
adenopathy.

Reproductive: Uterus mildly displaced anteriorly from the enlarged
rectal vault. Uterus and ovaries otherwise unremarkable. No adnexal
mass. Probable right ovarian corpus luteal cyst noted.

Other: No free air or fluid.

Musculoskeletal: No acute osseous abnormality. No worrisome lytic or
blastic osseous lesions. Thoracolumbar scoliosis noted.
IMPRESSION: 1. Severe fecal impaction with marked dilatation of the rectal vault
as above, consistent with constipation. Colon is diffusely dilated
containing gas and stool proximally.
2. No other acute intra-abdominal or pelvic process.

## 2019-08-08 IMAGING — CR DG ABDOMEN ACUTE W/ 1V CHEST
3 series · 3 of 3 positions shown · non-contrast
Comparison: Chest x-ray dated March 03, 2010. Abdominal x-ray dated
November 26, 2007.

CLINICAL DATA: Constipation.

EXAM:
DG ABDOMEN ACUTE W/ 1V CHEST

[abdomen erect]
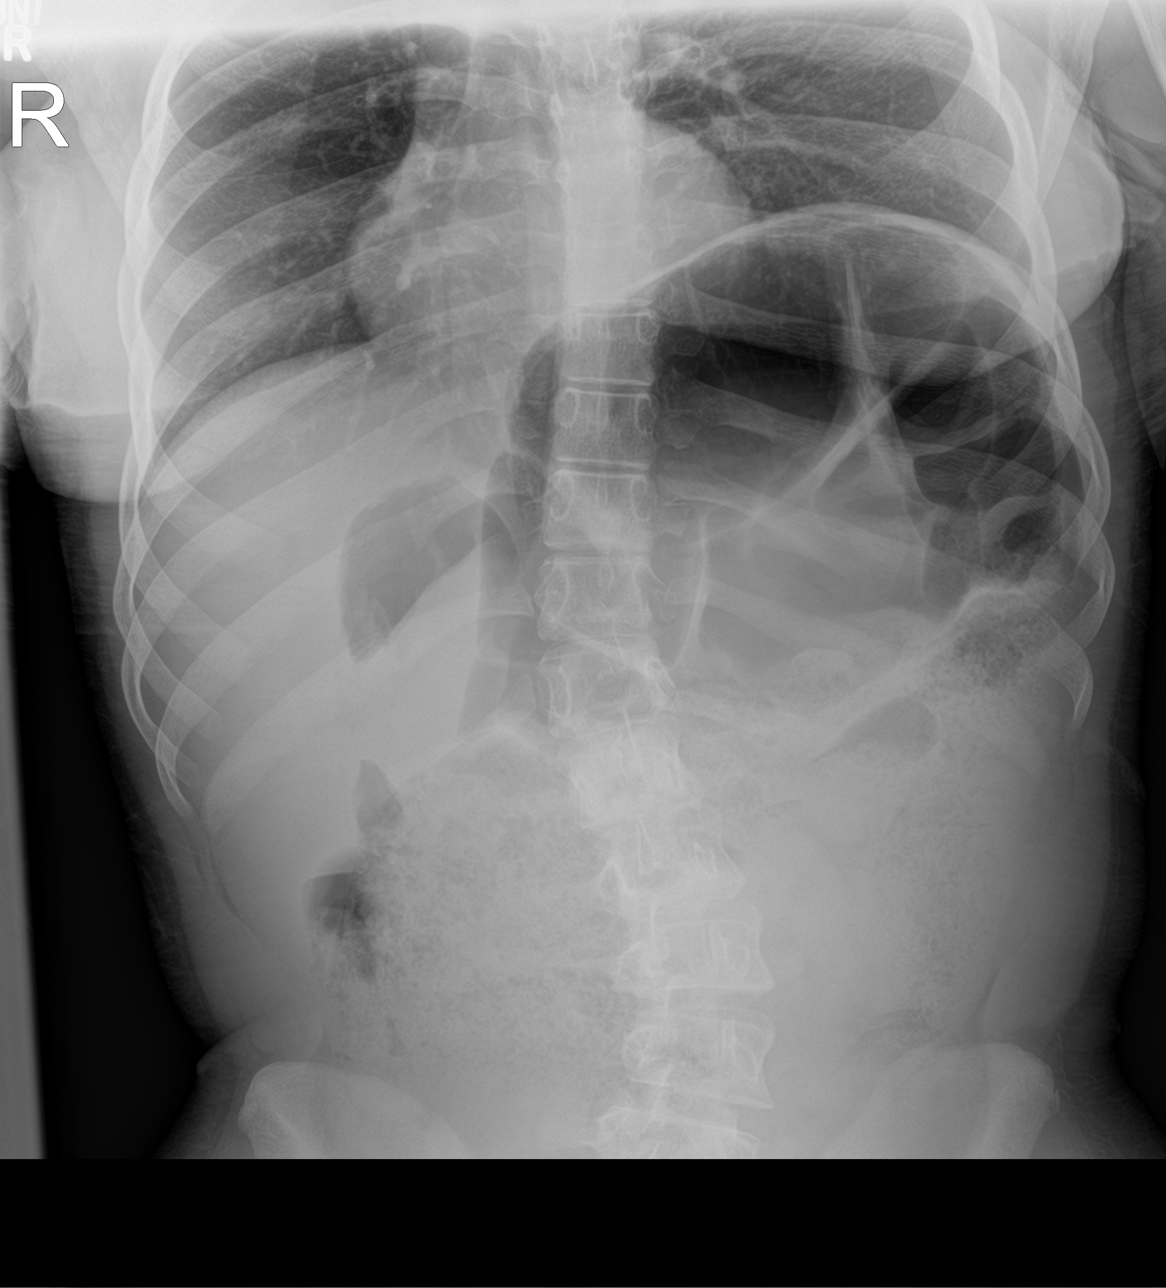

[abdomen supine]
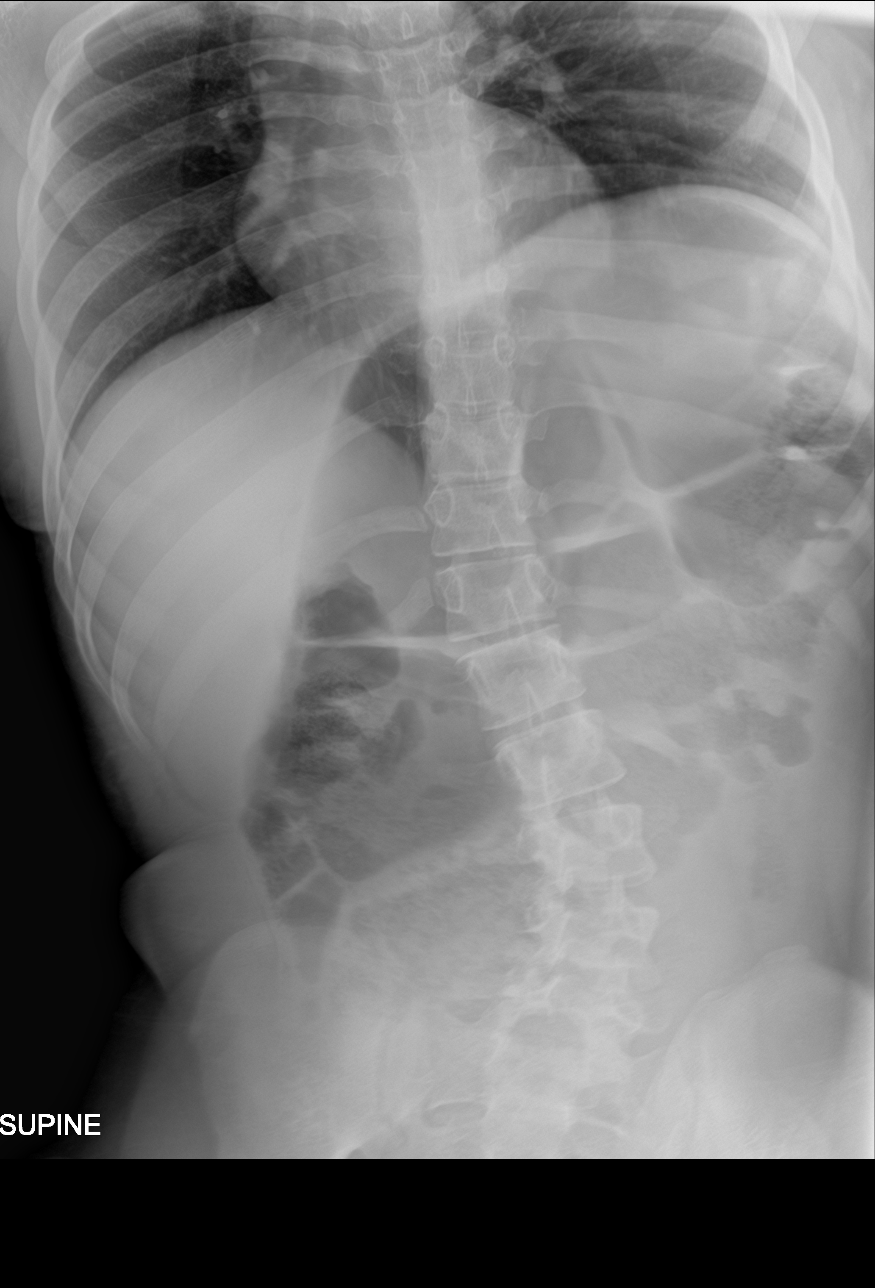

[chest ap]
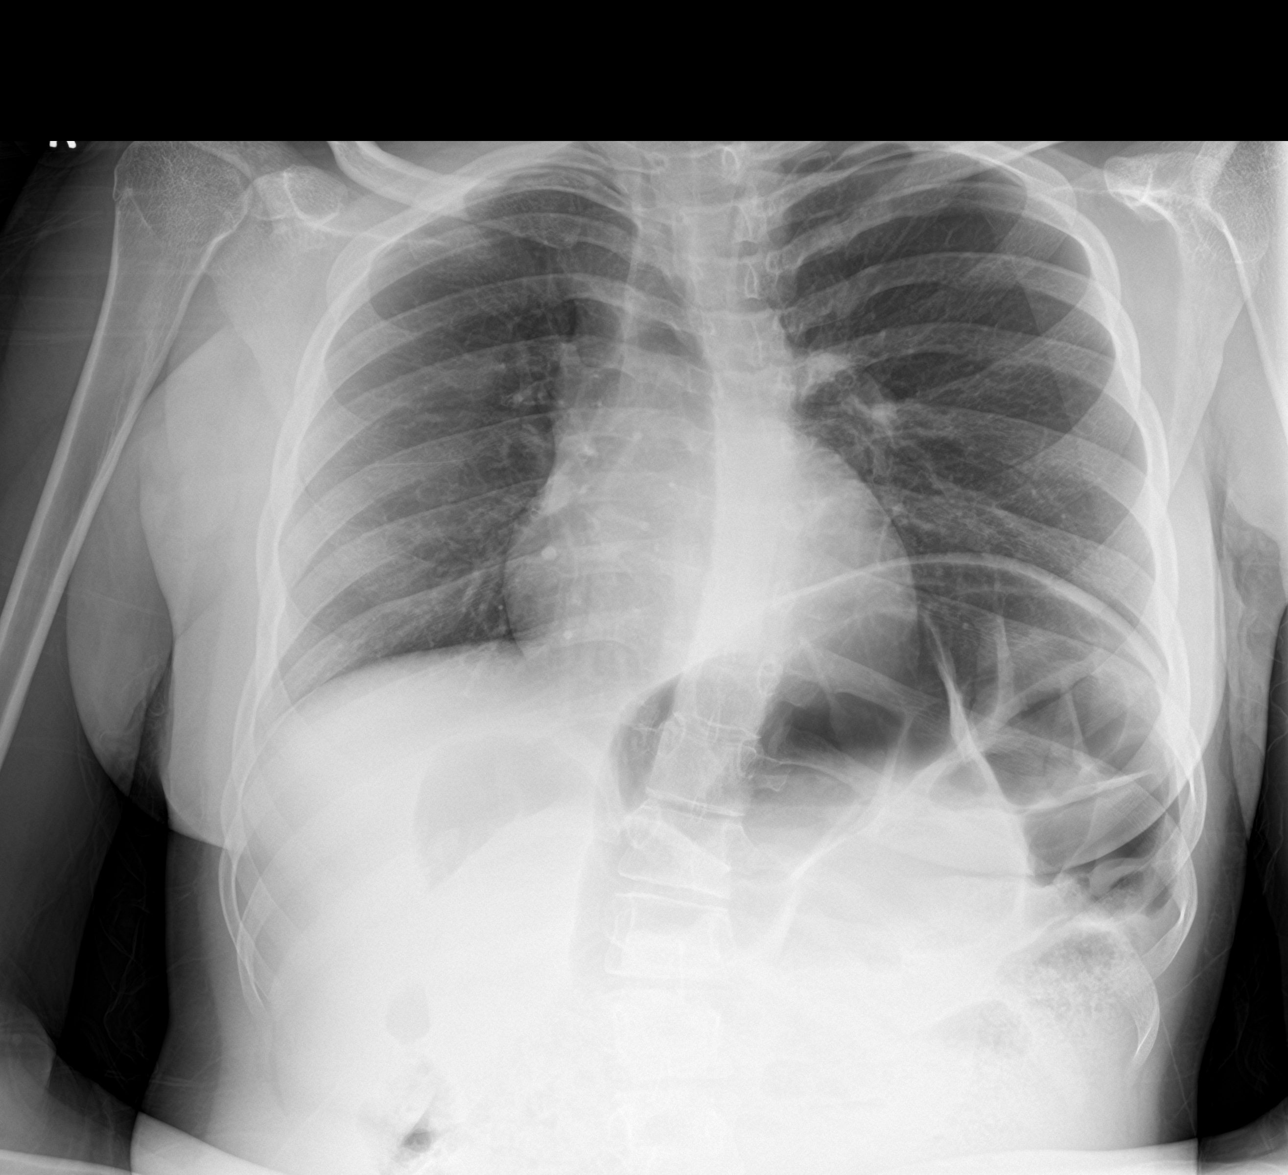

[3 of 3 positions shown; findings below may reference images not displayed]

FINDINGS: The cardiomediastinal silhouette is normal in size. Normal pulmonary
vascularity. No focal consolidation, pleural effusion, or
pneumothorax. No acute osseous abnormality.

There is marked gaseous distention of the transverse colon and
splenic flexure, as well as the stomach. Large stool burden
throughout the remaining visualized colon. No free intraperitoneal
air.
IMPRESSION: 1. Gastric and transverse colonic gaseous distention. Large stool
burden throughout the remaining colon.

## 2019-09-05 DIAGNOSIS — R32 Unspecified urinary incontinence: Secondary | ICD-10-CM | POA: Diagnosis not present

## 2019-09-05 DIAGNOSIS — G809 Cerebral palsy, unspecified: Secondary | ICD-10-CM | POA: Diagnosis not present

## 2019-09-08 ENCOUNTER — Other Ambulatory Visit (INDEPENDENT_AMBULATORY_CARE_PROVIDER_SITE_OTHER): Payer: Self-pay | Admitting: Family

## 2019-09-08 DIAGNOSIS — G40309 Generalized idiopathic epilepsy and epileptic syndromes, not intractable, without status epilepticus: Secondary | ICD-10-CM

## 2019-09-08 DIAGNOSIS — R451 Restlessness and agitation: Secondary | ICD-10-CM

## 2019-09-09 ENCOUNTER — Other Ambulatory Visit (INDEPENDENT_AMBULATORY_CARE_PROVIDER_SITE_OTHER): Payer: Self-pay | Admitting: Family

## 2019-09-09 DIAGNOSIS — R451 Restlessness and agitation: Secondary | ICD-10-CM

## 2019-09-09 MED ORDER — CLONAZEPAM 2 MG PO TABS: ORAL_TABLET | ORAL | 5 refills | Status: DC

## 2019-10-07 DIAGNOSIS — G809 Cerebral palsy, unspecified: Secondary | ICD-10-CM | POA: Diagnosis not present

## 2019-10-07 DIAGNOSIS — R32 Unspecified urinary incontinence: Secondary | ICD-10-CM | POA: Diagnosis not present

## 2019-11-03 ENCOUNTER — Other Ambulatory Visit: Payer: Self-pay

## 2019-11-03 DIAGNOSIS — Z20822 Contact with and (suspected) exposure to covid-19: Secondary | ICD-10-CM

## 2019-11-04 LAB — NOVEL CORONAVIRUS, NAA: SARS-CoV-2, NAA: NOT DETECTED

## 2019-11-25 DIAGNOSIS — G809 Cerebral palsy, unspecified: Secondary | ICD-10-CM | POA: Diagnosis not present

## 2019-11-25 DIAGNOSIS — R32 Unspecified urinary incontinence: Secondary | ICD-10-CM | POA: Diagnosis not present

## 2019-12-23 DIAGNOSIS — R6250 Unspecified lack of expected normal physiological development in childhood: Secondary | ICD-10-CM | POA: Diagnosis not present

## 2019-12-23 DIAGNOSIS — G40309 Generalized idiopathic epilepsy and epileptic syndromes, not intractable, without status epilepticus: Secondary | ICD-10-CM | POA: Diagnosis not present

## 2019-12-23 DIAGNOSIS — Q02 Microcephaly: Secondary | ICD-10-CM | POA: Diagnosis not present

## 2019-12-23 DIAGNOSIS — G808 Other cerebral palsy: Secondary | ICD-10-CM | POA: Diagnosis not present

## 2019-12-24 DIAGNOSIS — R32 Unspecified urinary incontinence: Secondary | ICD-10-CM | POA: Diagnosis not present

## 2019-12-24 DIAGNOSIS — G809 Cerebral palsy, unspecified: Secondary | ICD-10-CM | POA: Diagnosis not present

## 2020-01-21 DIAGNOSIS — G809 Cerebral palsy, unspecified: Secondary | ICD-10-CM | POA: Diagnosis not present

## 2020-01-21 DIAGNOSIS — R32 Unspecified urinary incontinence: Secondary | ICD-10-CM | POA: Diagnosis not present

## 2020-02-19 DIAGNOSIS — R32 Unspecified urinary incontinence: Secondary | ICD-10-CM | POA: Diagnosis not present

## 2020-02-19 DIAGNOSIS — G809 Cerebral palsy, unspecified: Secondary | ICD-10-CM | POA: Diagnosis not present

## 2020-03-23 ENCOUNTER — Other Ambulatory Visit (INDEPENDENT_AMBULATORY_CARE_PROVIDER_SITE_OTHER): Payer: Self-pay | Admitting: Family

## 2020-03-23 DIAGNOSIS — R451 Restlessness and agitation: Secondary | ICD-10-CM

## 2020-03-24 NOTE — Telephone Encounter (Signed)
Please send to the pharmacy °

## 2020-05-05 DIAGNOSIS — R339 Retention of urine, unspecified: Secondary | ICD-10-CM | POA: Diagnosis not present

## 2020-05-05 DIAGNOSIS — G809 Cerebral palsy, unspecified: Secondary | ICD-10-CM | POA: Diagnosis not present

## 2020-05-06 DIAGNOSIS — R339 Retention of urine, unspecified: Secondary | ICD-10-CM | POA: Diagnosis not present

## 2020-05-06 DIAGNOSIS — G809 Cerebral palsy, unspecified: Secondary | ICD-10-CM | POA: Diagnosis not present

## 2020-05-07 DIAGNOSIS — G808 Other cerebral palsy: Secondary | ICD-10-CM | POA: Diagnosis not present

## 2020-06-08 DIAGNOSIS — G809 Cerebral palsy, unspecified: Secondary | ICD-10-CM | POA: Diagnosis not present

## 2020-06-08 DIAGNOSIS — R32 Unspecified urinary incontinence: Secondary | ICD-10-CM | POA: Diagnosis not present

## 2020-06-08 DIAGNOSIS — R339 Retention of urine, unspecified: Secondary | ICD-10-CM | POA: Diagnosis not present

## 2020-07-24 DIAGNOSIS — G809 Cerebral palsy, unspecified: Secondary | ICD-10-CM | POA: Diagnosis not present

## 2020-07-24 DIAGNOSIS — R339 Retention of urine, unspecified: Secondary | ICD-10-CM | POA: Diagnosis not present

## 2020-07-24 DIAGNOSIS — R32 Unspecified urinary incontinence: Secondary | ICD-10-CM | POA: Diagnosis not present

## 2020-08-04 ENCOUNTER — Other Ambulatory Visit (INDEPENDENT_AMBULATORY_CARE_PROVIDER_SITE_OTHER): Payer: Self-pay | Admitting: Family

## 2020-08-04 DIAGNOSIS — G40309 Generalized idiopathic epilepsy and epileptic syndromes, not intractable, without status epilepticus: Secondary | ICD-10-CM

## 2020-08-05 NOTE — Telephone Encounter (Signed)
Please send to the pharmacy °

## 2020-08-06 ENCOUNTER — Encounter (INDEPENDENT_AMBULATORY_CARE_PROVIDER_SITE_OTHER): Payer: Self-pay | Admitting: Family

## 2020-08-06 ENCOUNTER — Telehealth (INDEPENDENT_AMBULATORY_CARE_PROVIDER_SITE_OTHER): Payer: BC Managed Care – PPO | Admitting: Family

## 2020-08-06 VITALS — Wt 105.0 lb

## 2020-08-06 DIAGNOSIS — G808 Other cerebral palsy: Secondary | ICD-10-CM | POA: Diagnosis not present

## 2020-08-06 DIAGNOSIS — G40309 Generalized idiopathic epilepsy and epileptic syndromes, not intractable, without status epilepticus: Secondary | ICD-10-CM

## 2020-08-06 DIAGNOSIS — R6250 Unspecified lack of expected normal physiological development in childhood: Secondary | ICD-10-CM

## 2020-08-06 DIAGNOSIS — R451 Restlessness and agitation: Secondary | ICD-10-CM | POA: Diagnosis not present

## 2020-08-06 DIAGNOSIS — Q02 Microcephaly: Secondary | ICD-10-CM

## 2020-08-06 MED ORDER — CARBATROL 200 MG PO CP12: 200.0000 mg | ORAL_CAPSULE | Freq: Every evening | ORAL | 3 refills | Status: DC

## 2020-08-06 MED ORDER — CARBATROL 300 MG PO CP12: ORAL_CAPSULE | ORAL | 3 refills | Status: DC

## 2020-08-06 MED ORDER — CLONAZEPAM 2 MG PO TABS: ORAL_TABLET | ORAL | 3 refills | Status: DC

## 2020-08-06 NOTE — Progress Notes (Signed)
This is a Pediatric Specialist E-Visit follow up consult provided via Caregility Ebony Young and her father Ebony Young consented to an E-Visit consult today.  Location of patient: Ebony Young is at home Location of provider: Annamary Rummage at office Patient was referred by Tisovec, Adelfa Koh, MD   The following participants were involved in this E-Visit: CMA, NP, patient and her father  Chief Complain/ Reason for E-Visit today: seizures Total time on call: 15 min Follow up: 1 year  Ebony Young   MRN:  [id number]  [birth year]   Provider: Elveria Rising NP-C Location of Care: [facility] Child Neurology  Visit type: Routine Follow-Up  Last visit: 04/23/2019  Referral source: Jerrye Noble, MD History from: father, [facility] chart  Brief history: Seizures/Quadriparesis Copied from previous record: History of seizures, severe cognitive impairment and 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 She is taking and tolerating Carbatrol for seizures and has remained seizure free for many years. An EEG performed in 2015 determined that it would not be advisable for her to taper off medication. She has episodes of agitation and irritability, particularly the week prior to and at the onset of her menstrual cycle. She has longstanding history of biting her hands, and wears bilateral arm splints to prevent her from harming herself.   Today's concerns: Dad reports today that Markasia has been going to activities during the day with a caregiver and enjoys these outings. She is restless during the video visit and Dad explains that she is anxious to leave to meet her caregiver today.  Ebony Young has remained seizure free since her last visit. Dad says that she does not miss medication doses. There are problems with getting her to sleep at night but Clonazepam helps.   Ebony Young is unable to perform any activities of daily living independently and  requires a caregiver to attend to these needs. She has no language and is unable to communicate her wants and needs. Ebony Young has bouts of agitation and irritability that her parents usually manage with distraction but sometimes have to give a dose of Clonazepam to help her to be calmer.   Ebony Young has intermittent problems with constipation but has been otherwise generally healthy since she was last seen. Dad has no other health concerns for her today other than previously mentioned.  Review of systems: Please see HPI for neurologic and other pertinent review of systems. Otherwise all other systems were reviewed and were negative.  Problem List: Patient Active Problem List   Diagnosis Date Noted  . Agitation 12/17/2015  . Generalized convulsive epilepsy (HCC) 06/13/2013  . Congenital quadriplegia (HCC) 06/13/2013  . Microcephalus (HCC) 06/13/2013  . Severe developmental delay 06/13/2013     Past Medical History:  Diagnosis Date  . Quadriparesis (HCC)   . Seizures (HCC)     Past medical history comments: See HPI Copied from previous record: The patient had seizures in infancy. They subsided only to recur in 58 months of age. Over time it was clear that the patient had left greater than right hemiparesis. She was profoundly cognitively impaired. Her seizures were relatively infrequent on the order of 1-2 per year. Her last seizure was October 2008. She has taken tolerated medications well. The patient has problems with insomnia when she awakens she watches TV. She does not cry until she hears others awaken. The patient has problems with weight gain. She has also had problems with self mutilatory behavior which has been solved by placing  her arms and splints.   Her last MRI scan showed evidence of generalized atrophy and hydrocephalus ex vacuo. EEGs in the past showed diffuse slowing and right frontal spikes during sleep. She had an EEG July 03, 2006 that was an essentially normal record awake  and drowsy. She had a repeat EEG on October 28, 2014 that was abnormal as mentioned. She has not had a polysomnogram.  Surgical history: Past Surgical History:  Procedure Laterality Date  . MYRINGOTOMY WITH TUBE PLACEMENT      Family history: family history includes Cancer in her paternal grandmother; Pneumonia in her paternal grandfather.   Social history: Social History   Socioeconomic History  . Marital status: Single    Spouse name: Not on file  . Number of children: Not on file  . Years of education: Not on file  . Highest education level: Not on file  Occupational History  . Not on file  Tobacco Use  . Smoking status: Never Smoker  . Smokeless tobacco: Never Used  Substance and Sexual Activity  . Alcohol use: No  . Drug use: No  . Sexual activity: Never  Other Topics Concern  . 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 She lives with her parents. She enjoys watching TV and car rides.   Social Determinants of Health   Financial Resource Strain:   . Difficulty of Paying Living Expenses: Not on file  Food Insecurity:   . Worried About Programme researcher, broadcasting/film/video in the Last Year: Not on file  . Ran Out of Food in the Last Year: Not on file  Transportation Needs:   . Lack of Transportation (Medical): Not on file  . Lack of Transportation (Non-Medical): Not on file  Physical Activity:   . Days of Exercise per Week: Not on file  . Minutes of Exercise per Session: Not on file  Stress:   . Feeling of Stress : Not on file  Social Connections:   . Frequency of Communication with Friends and Family: Not on file  . Frequency of Social Gatherings with Friends and Family: Not on file  . Attends Religious Services: Not on file  . Active Member of Clubs or Organizations: Not on file  . Attends Banker Meetings: Not on file  . Marital Status: Not on file  Intimate Partner Violence:   . Fear of Current or Ex-Partner: Not on file  .  Emotionally Abused: Not on file  . Physically Abused: Not on file  . Sexually Abused: Not on file    Past/failed meds: Carbamazepine IR - ineffective  Allergies: No Known Allergies   Immunizations:  There is no immunization history on file for this patient.    Diagnostics/Screenings: Copied from previous record: rEEG 10/28/2014 - This is a abnormalrecord with the patient awake. The background shows very low voltage beta range activity and rhythmic delta range activity that could very well be artifactual. There is no dominant frequency and no change in state of arousal throughout the record. No seizure activity was seen. Ellison Carwin, MD  Physical Exam: Wt 105 lb (47.6 kg) Comment: reported  BMI 20.51 kg/m   General: well developed, well nourished young woman, seated in wheelchair, in no evident distress; black hair, brown eyes, non handed Head: microcephalic and atraumatic.No dysmorphic features. Neck: supple Musculoskeletal: No skeletal deformities or obvious scoliosis. Wearing bilateral arm/elbow splints to prevent her from getting her hands to her mouth Skin: no rashes  or neurocutaneous lesions  Neurologic Exam Mental Status: Awake and fully alert. Has no language. Does not smile responsively. Unable to follow any commands. Rocking back and forth at times.  Cranial Nerves: Fundoscopic exam - red reflex present.  Unable to fully visualize fundus.  Pupils equal briskly reactive to light.  Turns to localize faces and objects in the periphery. Turns to localize sounds in the periphery. Facial movements are asymmetric, has lower facial weakness with drooling.  Motor: Spastic quadriparesis  Sensory: Withdrawal x 4 Coordination: Unable to adequately assess due to patient's inability to participate in examination. Does not reach for objects. Gait and Station: Unable to stand and bear weight. Reflexes: Diminished and symmetric. Toes neutral. No clonus  Impression: 1.  Generalized convulsive epilepsy 2. Congenital spastic quadriparesis 3. Severe developmental delay 4. Microcephaly 5. Pervasive developmental disorder 6. Agitation 7. Self stimulatory behavior, including biting her hands  Recommendations for plan of care: The patient's previous [facility] records were reviewed. Aylla  has neither had nor required imaging or lab studies since the last visit. She is a 24 year old young woman with history of generalized convulsive seizures, congenital spastic quadriparesis, severe developmental delay, microcephaly, pervasive developmental disorder, agitation, and self stimulatory behaviors. She has remained seizure free on Carbatrol and will continue this medication for the foreseeable future. I will see Latonyia back in follow up in 1 year or sooner if needed. Dad agreed with the plans made today.   The medication list was reviewed and reconciled. No changes were made in the prescribed medications today. A complete medication list was provided to the patient.  Allergies as of 08/06/2020   No Known Allergies     Medication List       Accurate as of August 06, 2020 10:30 AM. If you have any questions, ask your nurse or doctor.        Carbatrol 200 MG 12 hr capsule Generic drug: carbamazepine TAKE ONE CAPSULE BY MOUTH EVERY EVENING   Carbatrol 200 MG 12 hr capsule Generic drug: carbamazepine TAKE ONE CAPSULE BY MOUTH EVERY EVENING   Carbatrol 300 MG 12 hr capsule Generic drug: carbamazepine TAKE 1 CAPSULE BY MOUTH EVERY DAY IN THE MORNING   cephALEXin 250 MG capsule Commonly known as: KEFLEX Take 250 mg by mouth 2 (two) times daily. For seven days   clonazePAM 2 MG tablet Commonly known as: KLONOPIN TAKE 1 TABLET BY MOUTH AT BEDTIME FOR AGITATION. MAY REPEAT 0.5 TABLET IN 2 HOURS IF NEEDED   famotidine 20 MG tablet Commonly known as: PEPCID Take 20 mg by mouth daily.   polyethylene glycol 17 g packet Commonly known as: MIRALAX / GLYCOLAX Mix one  capful in 8oz of fluid daily       Total time spent on the video with the patient and her father was 15 minutes, of which 50% or more was spent in counseling and coordination of care.  Elveria Rising NP-C [facility] Child Neurology Ph. [phone] Fax [phone]

## 2020-08-08 ENCOUNTER — Encounter (INDEPENDENT_AMBULATORY_CARE_PROVIDER_SITE_OTHER): Payer: Self-pay | Admitting: Family

## 2020-08-08 NOTE — Patient Instructions (Signed)
Thank you for coming in today.   Instructions for you until your next appointment are as follows: 1. Continue giving Ebony Young the Carbatrol and Clonazepam as prescribed.  2. Let me know if she has any seizures or if you have any concerns  3. Please sign up for MyChart if you have not done so 4. Please plan to return for follow up in one year or sooner if needed.

## 2020-09-17 DIAGNOSIS — G809 Cerebral palsy, unspecified: Secondary | ICD-10-CM | POA: Diagnosis not present

## 2020-09-17 DIAGNOSIS — R32 Unspecified urinary incontinence: Secondary | ICD-10-CM | POA: Diagnosis not present

## 2020-10-15 DIAGNOSIS — R32 Unspecified urinary incontinence: Secondary | ICD-10-CM | POA: Diagnosis not present

## 2020-10-15 DIAGNOSIS — G809 Cerebral palsy, unspecified: Secondary | ICD-10-CM | POA: Diagnosis not present

## 2020-12-17 DIAGNOSIS — E559 Vitamin D deficiency, unspecified: Secondary | ICD-10-CM | POA: Diagnosis not present

## 2020-12-17 DIAGNOSIS — G40309 Generalized idiopathic epilepsy and epileptic syndromes, not intractable, without status epilepticus: Secondary | ICD-10-CM | POA: Diagnosis not present

## 2020-12-17 DIAGNOSIS — G808 Other cerebral palsy: Secondary | ICD-10-CM | POA: Diagnosis not present

## 2020-12-23 DIAGNOSIS — Q02 Microcephaly: Secondary | ICD-10-CM | POA: Diagnosis not present

## 2020-12-23 DIAGNOSIS — G808 Other cerebral palsy: Secondary | ICD-10-CM | POA: Diagnosis not present

## 2020-12-23 DIAGNOSIS — G40309 Generalized idiopathic epilepsy and epileptic syndromes, not intractable, without status epilepticus: Secondary | ICD-10-CM | POA: Diagnosis not present

## 2020-12-23 DIAGNOSIS — Z0000 Encounter for general adult medical examination without abnormal findings: Secondary | ICD-10-CM | POA: Diagnosis not present

## 2020-12-29 DIAGNOSIS — R32 Unspecified urinary incontinence: Secondary | ICD-10-CM | POA: Diagnosis not present

## 2020-12-29 DIAGNOSIS — G809 Cerebral palsy, unspecified: Secondary | ICD-10-CM | POA: Diagnosis not present

## 2021-02-16 DIAGNOSIS — G809 Cerebral palsy, unspecified: Secondary | ICD-10-CM | POA: Diagnosis not present

## 2021-02-16 DIAGNOSIS — R32 Unspecified urinary incontinence: Secondary | ICD-10-CM | POA: Diagnosis not present

## 2021-03-21 ENCOUNTER — Other Ambulatory Visit (INDEPENDENT_AMBULATORY_CARE_PROVIDER_SITE_OTHER): Payer: Self-pay | Admitting: Family

## 2021-03-21 DIAGNOSIS — R451 Restlessness and agitation: Secondary | ICD-10-CM

## 2021-03-22 NOTE — Telephone Encounter (Signed)
Please send to the pharmacy °

## 2021-03-26 DIAGNOSIS — R32 Unspecified urinary incontinence: Secondary | ICD-10-CM | POA: Diagnosis not present

## 2021-03-26 DIAGNOSIS — G809 Cerebral palsy, unspecified: Secondary | ICD-10-CM | POA: Diagnosis not present

## 2021-03-26 DIAGNOSIS — R339 Retention of urine, unspecified: Secondary | ICD-10-CM | POA: Diagnosis not present

## 2021-04-25 ENCOUNTER — Encounter (INDEPENDENT_AMBULATORY_CARE_PROVIDER_SITE_OTHER): Payer: Self-pay

## 2021-05-13 DIAGNOSIS — R32 Unspecified urinary incontinence: Secondary | ICD-10-CM | POA: Diagnosis not present

## 2021-05-13 DIAGNOSIS — R339 Retention of urine, unspecified: Secondary | ICD-10-CM | POA: Diagnosis not present

## 2021-05-13 DIAGNOSIS — G809 Cerebral palsy, unspecified: Secondary | ICD-10-CM | POA: Diagnosis not present

## 2021-06-08 DIAGNOSIS — G809 Cerebral palsy, unspecified: Secondary | ICD-10-CM | POA: Diagnosis not present

## 2021-06-08 DIAGNOSIS — R339 Retention of urine, unspecified: Secondary | ICD-10-CM | POA: Diagnosis not present

## 2021-06-08 DIAGNOSIS — R32 Unspecified urinary incontinence: Secondary | ICD-10-CM | POA: Diagnosis not present

## 2021-07-09 ENCOUNTER — Other Ambulatory Visit (INDEPENDENT_AMBULATORY_CARE_PROVIDER_SITE_OTHER): Payer: Self-pay | Admitting: Family

## 2021-07-09 DIAGNOSIS — G809 Cerebral palsy, unspecified: Secondary | ICD-10-CM | POA: Diagnosis not present

## 2021-07-09 DIAGNOSIS — R32 Unspecified urinary incontinence: Secondary | ICD-10-CM | POA: Diagnosis not present

## 2021-07-09 DIAGNOSIS — G40309 Generalized idiopathic epilepsy and epileptic syndromes, not intractable, without status epilepticus: Secondary | ICD-10-CM

## 2021-07-09 DIAGNOSIS — R339 Retention of urine, unspecified: Secondary | ICD-10-CM | POA: Diagnosis not present

## 2021-08-24 DIAGNOSIS — G809 Cerebral palsy, unspecified: Secondary | ICD-10-CM | POA: Diagnosis not present

## 2021-08-24 DIAGNOSIS — R32 Unspecified urinary incontinence: Secondary | ICD-10-CM | POA: Diagnosis not present

## 2021-10-07 DIAGNOSIS — G809 Cerebral palsy, unspecified: Secondary | ICD-10-CM | POA: Diagnosis not present

## 2021-10-07 DIAGNOSIS — R32 Unspecified urinary incontinence: Secondary | ICD-10-CM | POA: Diagnosis not present

## 2021-10-08 ENCOUNTER — Other Ambulatory Visit (INDEPENDENT_AMBULATORY_CARE_PROVIDER_SITE_OTHER): Payer: Self-pay | Admitting: Pediatrics

## 2021-10-08 ENCOUNTER — Other Ambulatory Visit (INDEPENDENT_AMBULATORY_CARE_PROVIDER_SITE_OTHER): Payer: Self-pay | Admitting: Family

## 2021-10-08 DIAGNOSIS — G40309 Generalized idiopathic epilepsy and epileptic syndromes, not intractable, without status epilepticus: Secondary | ICD-10-CM

## 2021-10-20 ENCOUNTER — Telehealth (INDEPENDENT_AMBULATORY_CARE_PROVIDER_SITE_OTHER): Payer: BC Managed Care – PPO | Admitting: Family

## 2021-10-20 ENCOUNTER — Other Ambulatory Visit: Payer: Self-pay

## 2021-10-20 ENCOUNTER — Encounter (INDEPENDENT_AMBULATORY_CARE_PROVIDER_SITE_OTHER): Payer: Self-pay | Admitting: Family

## 2021-10-20 VITALS — BP 136/78 | Temp 96.9°F | Wt 110.0 lb

## 2021-10-20 DIAGNOSIS — G808 Other cerebral palsy: Secondary | ICD-10-CM | POA: Diagnosis not present

## 2021-10-20 DIAGNOSIS — R6250 Unspecified lack of expected normal physiological development in childhood: Secondary | ICD-10-CM

## 2021-10-20 DIAGNOSIS — R451 Restlessness and agitation: Secondary | ICD-10-CM

## 2021-10-20 DIAGNOSIS — G40309 Generalized idiopathic epilepsy and epileptic syndromes, not intractable, without status epilepticus: Secondary | ICD-10-CM | POA: Diagnosis not present

## 2021-10-20 MED ORDER — CARBATROL 200 MG PO CP12: ORAL_CAPSULE | ORAL | 5 refills | Status: DC

## 2021-10-20 MED ORDER — DIAZEPAM 2 MG PO TABS: ORAL_TABLET | ORAL | 5 refills | Status: DC

## 2021-10-20 MED ORDER — CARBATROL 300 MG PO CP12: ORAL_CAPSULE | ORAL | 5 refills | Status: DC

## 2021-10-20 NOTE — Progress Notes (Signed)
This is a Pediatric Specialist E-Visit consult/follow up provided via Caregility video Ebony Young and her mother Ebony Young consented to an E-Visit consult today.  Location of patient: Ebony Young is at home Location of provider: Damita Dunnings is at office Patient was referred by Tisovec, Adelfa Koh, MD   The following participants were involved in this E-Visit: CMA, NP, patient and her mother   This visit was done via VIDEO   Chief Complain/ Reason for E-Visit today: seizure follow up Total time on call: 20 min Follow up: 1 year   Ebony Young   MRN:  [id number]  30-Jun-1996   Provider: Elveria Rising NP-C Location of Care: [facility] Child Neurology  Visit type: follow up   Last visit: 08/06/2020  Referral source: Guerry Bruin, MD History from: Epic chart and patient's mother  Brief history:  Copied from previous record: History of seizures, severe cognitive impairment and 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 She is taking and tolerating Carbatrol for seizures and has remained seizure free for many years. An EEG performed in 2015 determined that it would not be advisable for her to taper off medication. She has episodes of agitation and irritability, particularly the week prior to and at the onset of her menstrual cycle. She has longstanding history of biting her hands, and wears bilateral arm splints to prevent her from harming herself. Ebony Young also has insomnia which can be problematic at times.  Today's concerns: Mom reports today that Ebony Young tested positive for Covid-19 infection in August but that she weathered it well. She did not have seizures during that illness. She had an episode recently in which she was excessively sleepy when Mom awakened her for the day. Mom wonders if she had a seizure during sleep but saw no evidence of it.   Mom reports that Ebony Young has a good appetite. She has been doing  better about drinking water and has had less constipation. Ebony Young continues to have difficulty with initiating and maintaining sleep. She is taking Clonazepam at bedtime but Mom reports that it has not helped with insomnia. Mom is interested in other treatments for this problem.  Ebony Young has been otherwise generally healthy since she was last seen. Mom has no other health concerns for Ebony Young today other than previously mentioned.  Review of systems: Please see HPI for neurologic and other pertinent review of systems. Otherwise all other systems were reviewed and were negative.  Problem List: Patient Active Problem List   Diagnosis Date Noted   Agitation 12/17/2015   Generalized convulsive epilepsy (HCC) 06/13/2013   Congenital quadriplegia (HCC) 06/13/2013   Microcephalus (HCC) 06/13/2013   Severe developmental delay 06/13/2013     Past Medical History:  Diagnosis Date   Quadriparesis (HCC)    Seizures (HCC)     Past medical history comments: See HPI Copied from previous record: The patient had seizures in infancy. They subsided only to recur in 70 months of age. Over time it was clear that the patient had left greater than right hemiparesis. She was profoundly cognitively impaired. Her seizures were relatively infrequent on the order of 1-2 per year. Her last seizure was October 2008. She has taken tolerated medications well.  The patient has problems with insomnia when she awakens she watches TV. She does not cry until she hears others awaken.  The patient has problems with weight gain. She has also had problems with self mutilatory behavior which has been solved by  placing her arms and splints.     Her last MRI scan showed evidence of generalized atrophy and hydrocephalus ex vacuo. EEGs in the past showed diffuse slowing and right frontal spikes during sleep. She had an EEG July 03, 2006 that was an essentially normal record awake and drowsy. She had a repeat EEG on October 28, 2014 that  was abnormal as mentioned. She has not had a polysomnogram.  Surgical history: Past Surgical History:  Procedure Laterality Date   MYRINGOTOMY WITH TUBE PLACEMENT       Family history: family history includes Cancer in her paternal grandmother; Pneumonia in her paternal grandfather.   Social history: Social History   Socioeconomic History   Marital status: Single    Spouse name: Not on file   Number of children: Not on file   Years of education: Not on file   Highest education level: Not on file  Occupational History   Not on file  Tobacco Use   Smoking status: Never   Smokeless tobacco: Never  Substance and Sexual Activity   Alcohol use: No   Drug use: No   Sexual activity: Never  Other Topics Concern   Not on file  Social History Narrative   Hartlyn aged out [employer] and is now at [employer] . She lives with her parents. She enjoys watching TV and car rides.   Social Determinants of Health   Financial Resource Strain: Not on file  Food Insecurity: Not on file  Transportation Needs: Not on file  Physical Activity: Not on file  Stress: Not on file  Social Connections: Not on file  Intimate Partner Violence: Not on file    Past/failed meds: Copied from previous record: Carbamazepine IR - ineffective   Allergies: No Known Allergies   Immunizations:  There is no immunization history on file for this patient.   Diagnostics/Screenings: Copied from previous record: rEEG 10/28/2014 - This is a abnormal record with the patient awake.  The background shows very low voltage beta range activity and rhythmic delta range activity that could very well be artifactual.  There is no dominant frequency and no change in state of arousal throughout the record.  No seizure activity was seen. Ellison Carwin, MD  Physical Exam: BP 136/78   Temp (!) 96.9 F (36.1 C)   Wt 110 lb (49.9 kg)   BMI 21.48 kg/m   Examination was limited due to video format General: well  developed, well nourished girl, seated at home, in no evident distress Head: microcephalic and atraumatic. No dysmorphic features. Neck: supple Musculoskeletal: no skeletal deformities or obvious scoliosis. Wears splints on her arms to prevent her from biting her hands.  Skin: no rashes or neurocutaneous lesions  Neurologic Exam Mental Status: awake and fully alert. Has no language.  Smiles but not responsively.  Cranial Nerves: fundoscopic exam - red reflex present.  Unable to fully visualize fundus. Turns to localize faces and objects in the periphery. Turns to localize sounds in the periphery. Facial movements are asymmetric, has lower facial weakness with drooling.  Motor: spastic quadriparesis  Sensory: withdrawal x 4 Coordination: unable to adequately assess due to patient's inability to participate in examination. Does not reach for objects. Gait and Station: unable to stand and bear weight.   Impression: Generalized convulsive epilepsy (HCC) - Plan: CARBATROL 200 MG 12 hr capsule, CARBATROL 300 MG 12 hr capsule  Agitation - Plan: diazepam (VALIUM) 2 MG tablet  Congenital quadriplegia (HCC)  Severe developmental delay   Recommendations  for plan of care: The patient's previous [facility] records were reviewed. Wallace has neither had nor required imaging or lab studies since the last visit. She is a 25 year old young woman with history of spastic quadriparesis, severe intellectual delay and seizures. She is taking and tolerating Carbatrol for her seizure disorder and has remained seizure free since 2015. She had a recent episode of excessive sleepiness in the morning but it is not clear to me that she had a seizure during sleep. Meliyah will continue her medications without change for now. I asked Mom to let me know if seizures occur.  Seylah continues to have significant problems with sleep. She has been taking Clonazepam which has been ineffective. I recommended a trial of Diazepam and  asked Mom to let me know how it works. I will otherwise see Jaquayla in follow up in 1 year or sooner if needed. Mom agreed with the plans made today.   The medication list was reviewed and reconciled. I reviewed changes that were made in the prescribed medications today. A complete medication list was provided to the patient.  Return in about 1 year (around 10/20/2022).   Allergies as of 10/20/2021   No Known Allergies      Medication List        Accurate as of October 20, 2021 11:13 AM. If you have any questions, ask your nurse or doctor.          STOP taking these medications    clonazePAM 2 MG tablet Commonly known as: KLONOPIN Stopped by: Elveria Rising, NP       TAKE these medications    Carbatrol 200 MG 12 hr capsule Generic drug: carbamazepine TAKE 1 CAPSULE BY MOUTH EVERY DAY IN THE EVENING   Carbatrol 300 MG 12 hr capsule Generic drug: carbamazepine TAKE 1 CAPSULE BY MOUTH EVERY DAY IN THE MORNING   diazepam 2 MG tablet Commonly known as: VALIUM Give 1 tablet 30 minutes before bedtime. Started by: Elveria Rising, NP   nystatin powder Commonly known as: MYCOSTATIN/NYSTOP Apply 1 application topically 2 (two) times daily.   pantoprazole 40 MG tablet Commonly known as: PROTONIX Take 40 mg by mouth daily.   polyethylene glycol 17 g packet Commonly known as: MIRALAX / GLYCOLAX Mix one capful in 8oz of fluid daily      Total time spent with the patient was 20 minutes, of which 50% or more was spent in counseling and coordination of care.  Elveria Rising NP-C [facility] Child Neurology Ph. [phone] Fax [phone]

## 2021-10-20 NOTE — Patient Instructions (Signed)
Thank you for meeting with me by video today.   Instructions for you until your next appointment are as follows: I sent in a prescription for Diazepam for Jasmine to take at bedtime. Stop giving the Clonazepam when you start the Diazepam.  Let me know how the Diazepam works for [employer]. We can adjust the dose if needed.  I sent in refills for Carbatrol. Medicaid required a prior authorization for the brand Carbatrol so that was done as well. It will be good for a year, then we will need to do it again unless Medicaid changes their formulary.  Let me know if Chrystie has any seizures.  If Kimimila has more episodes of being excessively sleepy in the morning and you think a seizure may have occurred at night, let me know.  Please sign up for MyChart if you have not done so. Please plan to return for follow up in one year or sooner if needed.  At [facility], we are committed to providing exceptional care. You will receive a patient satisfaction survey through text or email regarding your visit today. Your opinion is important to me. Comments are appreciated.

## 2021-10-29 ENCOUNTER — Encounter (INDEPENDENT_AMBULATORY_CARE_PROVIDER_SITE_OTHER): Payer: Self-pay | Admitting: Family

## 2021-11-12 ENCOUNTER — Other Ambulatory Visit (INDEPENDENT_AMBULATORY_CARE_PROVIDER_SITE_OTHER): Payer: Self-pay | Admitting: Neurology

## 2021-11-12 DIAGNOSIS — G40309 Generalized idiopathic epilepsy and epileptic syndromes, not intractable, without status epilepticus: Secondary | ICD-10-CM

## 2021-11-15 MED ORDER — CARBATROL 200 MG PO CP12: ORAL_CAPSULE | ORAL | 5 refills | Status: DC

## 2021-11-18 DIAGNOSIS — G809 Cerebral palsy, unspecified: Secondary | ICD-10-CM | POA: Diagnosis not present

## 2021-11-18 DIAGNOSIS — R32 Unspecified urinary incontinence: Secondary | ICD-10-CM | POA: Diagnosis not present

## 2021-11-20 DIAGNOSIS — R32 Unspecified urinary incontinence: Secondary | ICD-10-CM | POA: Diagnosis not present

## 2021-11-20 DIAGNOSIS — G809 Cerebral palsy, unspecified: Secondary | ICD-10-CM | POA: Diagnosis not present

## 2022-01-07 DIAGNOSIS — G809 Cerebral palsy, unspecified: Secondary | ICD-10-CM | POA: Diagnosis not present

## 2022-01-07 DIAGNOSIS — R32 Unspecified urinary incontinence: Secondary | ICD-10-CM | POA: Diagnosis not present

## 2022-02-06 DIAGNOSIS — G809 Cerebral palsy, unspecified: Secondary | ICD-10-CM | POA: Diagnosis not present

## 2022-02-06 DIAGNOSIS — R32 Unspecified urinary incontinence: Secondary | ICD-10-CM | POA: Diagnosis not present

## 2022-03-16 DIAGNOSIS — G809 Cerebral palsy, unspecified: Secondary | ICD-10-CM | POA: Diagnosis not present

## 2022-03-16 DIAGNOSIS — R32 Unspecified urinary incontinence: Secondary | ICD-10-CM | POA: Diagnosis not present

## 2022-03-29 ENCOUNTER — Telehealth (INDEPENDENT_AMBULATORY_CARE_PROVIDER_SITE_OTHER): Payer: Self-pay | Admitting: Family

## 2022-03-29 NOTE — Telephone Encounter (Signed)
?  Name of who is calling: ?Wonda Horner  ?Caller's Relationship to Patient: ?Mother ? ?Best contact number: ?[phone] ? ?Provider they see: ?[facility]  ? ?Reason for call: ?Mom is calling in stating that [facility] stated that the office notes that was received was blank and they are needing the last recent office notes. ? ? ? ?PRESCRIPTION REFILL ONLY ? ?Name of prescription: ? ?Pharmacy: ? ? ?

## 2022-05-01 ENCOUNTER — Other Ambulatory Visit (INDEPENDENT_AMBULATORY_CARE_PROVIDER_SITE_OTHER): Payer: Self-pay | Admitting: Neurology

## 2022-05-01 DIAGNOSIS — G40309 Generalized idiopathic epilepsy and epileptic syndromes, not intractable, without status epilepticus: Secondary | ICD-10-CM

## 2022-08-15 ENCOUNTER — Encounter: Payer: BC Managed Care – PPO | Admitting: Internal Medicine

## 2022-10-11 ENCOUNTER — Telehealth (INDEPENDENT_AMBULATORY_CARE_PROVIDER_SITE_OTHER): Payer: BC Managed Care – PPO | Admitting: Family

## 2022-10-19 ENCOUNTER — Encounter (INDEPENDENT_AMBULATORY_CARE_PROVIDER_SITE_OTHER): Payer: Self-pay | Admitting: Family

## 2022-10-19 ENCOUNTER — Telehealth (INDEPENDENT_AMBULATORY_CARE_PROVIDER_SITE_OTHER): Payer: BC Managed Care – PPO | Admitting: Family

## 2022-10-19 VITALS — Wt 107.0 lb

## 2022-10-19 DIAGNOSIS — G40309 Generalized idiopathic epilepsy and epileptic syndromes, not intractable, without status epilepticus: Secondary | ICD-10-CM | POA: Diagnosis not present

## 2022-10-19 DIAGNOSIS — G808 Other cerebral palsy: Secondary | ICD-10-CM

## 2022-10-19 DIAGNOSIS — R6250 Unspecified lack of expected normal physiological development in childhood: Secondary | ICD-10-CM

## 2022-10-19 DIAGNOSIS — R451 Restlessness and agitation: Secondary | ICD-10-CM

## 2022-10-19 MED ORDER — DIAZEPAM 2 MG PO TABS: ORAL_TABLET | ORAL | 5 refills | Status: DC

## 2022-10-19 MED ORDER — CARBATROL 200 MG PO CP12: ORAL_CAPSULE | ORAL | 5 refills | Status: DC

## 2022-10-19 MED ORDER — CARBATROL 300 MG PO CP12: ORAL_CAPSULE | ORAL | 5 refills | Status: DC

## 2022-10-19 NOTE — Patient Instructions (Signed)
It was a pleasure to see you today!  Instructions for you until your next appointment are as follows: Continue giving Lindie's medications as prescribed Let me know if she has any seizures or if you have any concerns Please sign up for MyChart if you have not done so. Please plan to return for follow up in one year or sooner if needed.   Feel free to contact our office during normal business hours at [phone] with questions or concerns. If there is no answer or the call is outside business hours, please leave a message and our clinic staff will call you back within the next business day.  If you have an urgent concern, please stay on the line for our after-hours answering service and ask for the on-call neurologist.     I also encourage you to use MyChart to communicate with me more directly. If you have not yet signed up for MyChart within [facility], the front desk staff can help you. However, please note that this inbox is NOT monitored on nights or weekends, and response can take up to 2 business days.  Urgent matters should be discussed with the on-call pediatric neurologist.   At [facility], we are committed to providing exceptional care. You will receive a patient satisfaction survey through text or email regarding your visit today. Your opinion is important to me. Comments are appreciated.

## 2022-10-19 NOTE — Progress Notes (Signed)
This is a Pediatric Specialist E-Visit consult/follow up provided via My Chart Ebony Young and Ebony Young consented to an E-Visit consult today.  Location of patient: Ebony Young is at home. Location of provider: Damita Young is at office Patient was referred by Ebony Young, Ebony Koh, MD   The following participants were involved in this E-Visit: CMA, patient and Ebony mother  This visit was done via VIDEO   Chief Complain/ Reason for E-Visit today: seizure follow up Total time on call: 15 min Follow up: 1 year   Ebony Young   MRN:  [id number]  1996-02-18   Provider: Elveria Rising NP-C Location of Care: [facility]  Visit type: Video return visit  Last visit: 10/20/2021  Referral source: Ebony Young, Ebony Koh, MD  History from: Epic chart, patient's mother  Brief history:  Copied from previous record: History of seizures, severe cognitive impairment and 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 She is taking and tolerating Carbatrol for seizures and has remained seizure free for many years. An EEG performed in 2015 determined that it would not be advisable for Ebony to taper off medication. She has episodes of agitation and irritability, particularly the week prior to and at the onset of Ebony menstrual cycle. She has longstanding history of biting Ebony hands, and wears bilateral arm splints to prevent Ebony from harming herself. Ebony Young also has insomnia which can be problematic at times.   Today's concerns: Mom reports today that Ebony Young has remained seizure free since Ebony last visit. She has ongoing problems with agitation and self-injurious behavior and wears elbow splints to prevent Ebony from harming herself. She also has insomnia but at times will simply sit in Ebony bed at night and play after the family goes to bed.   Ebony Young has been otherwise generally healthy since she was last seen. Mom has no  other health concerns for Ebony Young today other than previously mentioned.  Review of systems: Please see HPI for neurologic and other pertinent review of systems. Otherwise all other systems were reviewed and were negative.  Problem List: Patient Active Problem List   Diagnosis Date Noted   Agitation 12/17/2015   Generalized convulsive epilepsy (HCC) 06/13/2013   Congenital quadriplegia (HCC) 06/13/2013   Microcephalus (HCC) 06/13/2013   Severe developmental delay 06/13/2013     Past Medical History:  Diagnosis Date   Quadriparesis (HCC)    Seizures (HCC)     Past medical history comments: See HPI Copied from previous record: The patient had seizures in infancy. They subsided only to recur in 64 months of age. Over time it was clear that the patient had left greater than right hemiparesis. She was profoundly cognitively impaired. Ebony seizures were relatively infrequent on the order of 1-2 per year. Ebony last seizure was October 2008. She has taken tolerated medications well.  The patient has problems with insomnia when she awakens she watches TV. She does not cry until she hears others awaken.  The patient has problems with weight gain. She has also had problems with self mutilatory behavior which has been solved by placing Ebony arms and splints.     Ebony last MRI scan showed evidence of generalized atrophy and hydrocephalus ex vacuo. EEGs in the past showed diffuse slowing and right frontal spikes during sleep. She had an EEG July 03, 2006 that was an essentially normal record awake and drowsy. She had a repeat EEG on October 28, 2014 that was  abnormal as mentioned. She has not had a polysomnogram.  Surgical history: Past Surgical History:  Procedure Laterality Date   MYRINGOTOMY WITH TUBE PLACEMENT       Family history: family history includes Cancer in Ebony paternal grandmother; Pneumonia in Ebony paternal grandfather.   Social history: Social History   Socioeconomic History    Marital status: Single    Spouse name: Not on file   Number of children: Not on file   Years of education: Not on file   Highest education level: Not on file  Occupational History   Not on file  Tobacco Use   Smoking status: Never   Smokeless tobacco: Never  Substance and Sexual Activity   Alcohol use: No   Drug use: No   Sexual activity: Never  Other Topics Concern   Not on file  Social History Narrative   Ebony Young aged out [employer] and is now at [employer] .   She lives with Ebony parents.    She enjoys watching TV and car rides.   Social Determinants of Health   Financial Resource Strain: Not on file  Food Insecurity: Not on file  Transportation Needs: Not on file  Physical Activity: Not on file  Stress: Not on file  Social Connections: Not on file  Intimate Partner Violence: Not on file    Past/failed meds: Copied from previous record: Carbamazepine IR - ineffective   Allergies: No Known Allergies   Immunizations:  There is no immunization history on file for this patient.   Diagnostics/Screenings: Copied from previous record: rEEG 10/28/2014 - This is a abnormal record with the patient awake.  The background shows very low voltage beta range activity and rhythmic delta range activity that could very well be artifactual.  There is no dominant frequency and no change in state of arousal throughout the record.  No seizure activity was seen. Ebony Copas, MD   Physical Exam: Wt 107 lb (48.5 kg) Comment: Pt's dad weighted Ebony 3 weeks ago  LMP 10/12/2022 (Exact Date)   BMI 20.90 kg/m   Examination was limited by video format General: well developed, well nourished woman, seated at home, in no evident distress Head: microcephalic and atraumatic. No dysmorphic features. Neck: supple Musculoskeletal: no skeletal deformities or obvious scoliosis. Wears splints on Ebony elbows to prevent Ebony from biting Ebony hands. Skin: no rashes or neurocutaneous  lesions  Neurologic Exam Mental Status: awake and fully alert. Has no language.   Cranial Nerves: turns to localize faces and objects in the periphery. Turns to localize sounds in the periphery. Facial movements are asymmetric, has lower facial weakness with drooling.  Motor: spastic quadriparesis  Sensory: withdrawal x 4 Coordination: unable to adequately assess due to patient's inability to participate in examination. Did not reach for objects. Gait and Station: unable to stand and bear weight.   Impression: Generalized convulsive epilepsy ([place]) - Plan: CARBATROL 200 MG 12 hr capsule, CARBATROL 300 MG 12 hr capsule  Agitation - Plan: diazepam (VALIUM) 2 MG tablet  Congenital quadriplegia (HCC)  Severe developmental delay   Recommendations for plan of care: The patient's previous Epic records were reviewed. Genasis has neither had nor required imaging or lab studies since the last visit. She has remained seizure free on Carbatrol and will continue on this medication without change for the foreseeable future. I asked Ebony mother to let me know if Alondra has any seizures or if she has any concerns. I will otherwise see Ebony back in follow  up in 1 year or sooner if needed. Mom agreed with the plans made today.   The medication list was reviewed and reconciled. No changes were made in the prescribed medications today. A complete medication list was provided to the patient.  Return in about 1 year (around 10/20/2023).   Allergies as of 10/19/2022   No Known Allergies      Medication List        Accurate as of October 19, 2022 11:59 PM. If you have any questions, ask your nurse or doctor.          Carbatrol 200 MG 12 hr capsule Generic drug: carbamazepine TAKE 1 CAPSULE BY MOUTH EVERY EVENING   Carbatrol 300 MG 12 hr capsule Generic drug: carbamazepine TAKE 1 CAPSULE BY MOUTH EVERY DAY IN THE MORNING   diazepam 2 MG tablet Commonly known as: VALIUM Give 1 tablet 30 minutes  before bedtime.   famotidine 20 MG tablet Commonly known as: PEPCID Take 20 mg by mouth at bedtime as needed.   nystatin powder Commonly known as: MYCOSTATIN/NYSTOP Apply 1 application topically 2 (two) times daily.   pantoprazole 40 MG tablet Commonly known as: PROTONIX Take 40 mg by mouth daily.   polyethylene glycol 17 g packet Commonly known as: MIRALAX / GLYCOLAX Mix one capful in 8oz of fluid daily      Total time spent with the patient was 15 minutes, of which 50% or more was spent in counseling and coordination of care.  Ebony Rising NP-C [facility] Ph. [phone] Fax [phone]

## 2022-10-23 ENCOUNTER — Encounter (INDEPENDENT_AMBULATORY_CARE_PROVIDER_SITE_OTHER): Payer: Self-pay | Admitting: Family

## 2023-02-01 ENCOUNTER — Other Ambulatory Visit (INDEPENDENT_AMBULATORY_CARE_PROVIDER_SITE_OTHER): Payer: Self-pay | Admitting: Family

## 2023-02-01 DIAGNOSIS — G40309 Generalized idiopathic epilepsy and epileptic syndromes, not intractable, without status epilepticus: Secondary | ICD-10-CM

## 2023-03-01 ENCOUNTER — Other Ambulatory Visit (INDEPENDENT_AMBULATORY_CARE_PROVIDER_SITE_OTHER): Payer: Self-pay | Admitting: Family

## 2023-03-01 DIAGNOSIS — G40309 Generalized idiopathic epilepsy and epileptic syndromes, not intractable, without status epilepticus: Secondary | ICD-10-CM

## 2023-03-04 ENCOUNTER — Other Ambulatory Visit (INDEPENDENT_AMBULATORY_CARE_PROVIDER_SITE_OTHER): Payer: Self-pay | Admitting: Family

## 2023-03-04 DIAGNOSIS — G40309 Generalized idiopathic epilepsy and epileptic syndromes, not intractable, without status epilepticus: Secondary | ICD-10-CM

## 2023-03-06 NOTE — Telephone Encounter (Signed)
Last OV 10/19/2022  Next OV 1 yr Carbatrol 200mg  printed and sent on 03/02/2023

## 2023-03-24 ENCOUNTER — Other Ambulatory Visit (INDEPENDENT_AMBULATORY_CARE_PROVIDER_SITE_OTHER): Payer: Self-pay | Admitting: Family

## 2023-03-24 DIAGNOSIS — G40309 Generalized idiopathic epilepsy and epileptic syndromes, not intractable, without status epilepticus: Secondary | ICD-10-CM

## 2023-09-10 ENCOUNTER — Other Ambulatory Visit (INDEPENDENT_AMBULATORY_CARE_PROVIDER_SITE_OTHER): Payer: Self-pay | Admitting: Family

## 2023-09-10 DIAGNOSIS — G40309 Generalized idiopathic epilepsy and epileptic syndromes, not intractable, without status epilepticus: Secondary | ICD-10-CM

## 2023-10-20 ENCOUNTER — Other Ambulatory Visit (INDEPENDENT_AMBULATORY_CARE_PROVIDER_SITE_OTHER): Payer: Self-pay | Admitting: Family

## 2023-10-20 DIAGNOSIS — G40309 Generalized idiopathic epilepsy and epileptic syndromes, not intractable, without status epilepticus: Secondary | ICD-10-CM

## 2023-11-23 ENCOUNTER — Telehealth (INDEPENDENT_AMBULATORY_CARE_PROVIDER_SITE_OTHER): Payer: BC Managed Care – PPO | Admitting: Family

## 2023-11-23 ENCOUNTER — Encounter (INDEPENDENT_AMBULATORY_CARE_PROVIDER_SITE_OTHER): Payer: Self-pay | Admitting: Family

## 2023-11-23 DIAGNOSIS — R451 Restlessness and agitation: Secondary | ICD-10-CM

## 2023-11-23 DIAGNOSIS — Q02 Microcephaly: Secondary | ICD-10-CM | POA: Diagnosis not present

## 2023-11-23 DIAGNOSIS — G808 Other cerebral palsy: Secondary | ICD-10-CM | POA: Diagnosis not present

## 2023-11-23 DIAGNOSIS — R6250 Unspecified lack of expected normal physiological development in childhood: Secondary | ICD-10-CM

## 2023-11-23 DIAGNOSIS — G40309 Generalized idiopathic epilepsy and epileptic syndromes, not intractable, without status epilepticus: Secondary | ICD-10-CM

## 2023-11-23 MED ORDER — CARBATROL 300 MG PO CP12: ORAL_CAPSULE | ORAL | 3 refills | Status: DC

## 2023-11-23 MED ORDER — CARBATROL 200 MG PO CP12: ORAL_CAPSULE | ORAL | 3 refills | Status: DC

## 2023-11-23 MED ORDER — DIAZEPAM 2 MG PO TABS: ORAL_TABLET | ORAL | 3 refills | Status: DC

## 2023-11-23 NOTE — Progress Notes (Signed)
This is a Pediatric Specialist E-Visit consult/follow up provided via My Chart Video Visit (Caregility). Ebony Young and her father Ebony Young consented to an E-Visit consult today.  Is the patient present for the video visit? Yes Location of patient: Ebony Young is at home Is the patient located in the state of [place]? Yes Location of provider: Elveria Rising, NP-C is at office Patient was referred by Tisovec, Ebony Koh, MD   The following participants were involved in this E-Visit: CMA, NP, patient's father  This visit was done via VIDEO   Chief Complain/ Reason for E-Visit today: seizure follow up Total time on call: 15 min Follow up: 1 year   Ebony Young   MRN:  [id number]  1996/03/09   Provider: Elveria Rising NP-C Location of Care: [facility]  Visit type: Return visit  Last visit: 10/19/2022  Referral source: Tisovec, Ebony Koh, MD History from: Epic chart and patient's father  Brief history:  Copied from previous record: History of seizures, severe cognitive impairment and 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 She is taking and tolerating Carbatrol for seizures and has remained seizure free for many years. An EEG performed in 2015 determined that it would not be advisable for her to taper off medication. She has episodes of agitation and irritability, particularly the week prior to and at the onset of her menstrual cycle. She has longstanding history of biting her hands, and wears bilateral arm splints to prevent her from harming herself. Ebony Young also has insomnia which can be problematic at times.   Due to her medical condition, Ebony Young is indefinitely incontinent of stool and urine.  It is medically necessary for her to use diapers, underpads, and gloves to assist with hygiene and skin integrity.     Today's concerns: She has remained seizure free since  her last visit.  She continues to have problems with agitation and self-injurious behavior. She wears elbow splints to prevent her from harming herself.  She continues have insomnia at times in which she sits in bed and plays during the night Dad notes that sometimes Ebony Young seems stiff in the mornings but that seems to resolve fairy quickly.  Ebony Young has been otherwise generally healthy since she was last seen. No health concerns today other than previously mentioned.  Review of systems: Please see HPI for neurologic and other pertinent review of systems. Otherwise all other systems were reviewed and were negative.  Problem List: Patient Active Problem List   Diagnosis Date Noted   Agitation 12/17/2015   Generalized convulsive epilepsy (HCC) 06/13/2013   Congenital quadriplegia (HCC) 06/13/2013   Microcephalus (HCC) 06/13/2013   Severe developmental delay 06/13/2013     Past Medical History:  Diagnosis Date   Quadriparesis (HCC)    Seizures (HCC)     Past medical history comments: See HPI Copied from previous record: The patient had seizures in infancy. They subsided only to recur in 45 months of age. Over time it was clear that the patient had left greater than right hemiparesis. She was profoundly cognitively impaired. Her seizures were relatively infrequent on the order of 1-2 per year. Her last seizure was October 2008. She has taken tolerated medications well.  The patient has problems with insomnia when she awakens she watches TV. She does not cry until she hears others awaken.  The patient has problems with weight gain. She has also had problems with self mutilatory behavior  which has been solved by placing her arms and splints.     Her last MRI scan showed evidence of generalized atrophy and hydrocephalus ex vacuo. EEGs in the past showed diffuse slowing and right frontal spikes during sleep. She had an EEG July 03, 2006 that was an essentially normal record awake and drowsy. She  had a repeat EEG on October 28, 2014 that was abnormal as mentioned. She has not had a polysomnogram.  Surgical history: Past Surgical History:  Procedure Laterality Date   MYRINGOTOMY WITH TUBE PLACEMENT      Family history: family history includes Cancer in her paternal grandmother; Pneumonia in her paternal grandfather.   Social history: Social History   Socioeconomic History   Marital status: Single    Spouse name: Not on file   Number of children: Not on file   Years of education: Not on file   Highest education level: Not on file  Occupational History   Not on file  Tobacco Use   Smoking status: Never   Smokeless tobacco: Never  Substance and Sexual Activity   Alcohol use: No   Drug use: No   Sexual activity: Never  Other Topics Concern   Not on file  Social History Narrative   Ebony Young aged out [employer] and is now at [employer] .   She lives with her parents.    She enjoys watching TV and car rides.   Social Determinants of Health   Financial Resource Strain: Not on file  Food Insecurity: Not on file  Transportation Needs: Not on file  Physical Activity: Not on file  Stress: Not on file  Social Connections: Not on file  Intimate Partner Violence: Not on file    Past/failed meds: Copied from previous record: Carbamazepine IR - ineffective   Allergies: No Known Allergies   Immunizations:  There is no immunization history on file for this patient.   Diagnostics/Screenings: Copied from previous record: rEEG 10/28/2014 - This is a abnormal record with the patient awake.  The background shows very low voltage beta range activity and rhythmic delta range activity that could very well be artifactual.  There is no dominant frequency and no change in state of arousal throughout the record.  No seizure activity was seen. Ellison Carwin, MD   Physical Exam: There were no vitals taken for this visit.  Examination was limited by video  format General: well developed, well nourished woman, seated at her home, in no evident distress Head: microcephalic and atraumatic. No dysmorphic features. Neck: supple Musculoskeletal: no skeletal deformities or obvious scoliosis. Has on elbow splints to prevent her from biting her hands Skin: no rashes or neurocutaneous lesions  Neurologic Exam Mental Status: awake and fully alert. Has no language.  Does not smile responsively. Took little notice of the video. Unable to follow instructions or participate in examination Cranial Nerves: turns to localize faces and objects in the periphery. Turns to localize sounds in the periphery. Facial movements are asymmetric, has lower facial weakness with drooling.  Motor: spastic quadriparesis  Sensory: withdrawal x 4 Coordination: unable to adequately assess due to patient's inability to participate in examination. Did not reach for objects. Gait and Station: unable to independently stand and bear weight.   Impression: Generalized convulsive epilepsy (HCC) - Plan: CARBATROL 200 MG 12 hr capsule, CARBATROL 300 MG 12 hr capsule  Congenital quadriplegia (HCC)  Microcephalus (HCC)  Severe developmental delay  Agitation - Plan: diazepam (VALIUM) 2 MG tablet  Recommendations for plan of care: The patient's previous Epic records were reviewed. No recent diagnostic studies to be reviewed with the patient.  Plan until next visit: Continue medications as prescribed  Call for questions or concerns Return in about 1 year (around 11/22/2024).  The medication list was reviewed and reconciled. No changes were made in the prescribed medications today. A complete medication list was provided to the patient.   Allergies as of 11/23/2023   No Known Allergies      Medication List        Accurate as of November 23, 2023 11:00 AM. If you have any questions, ask your nurse or doctor.          Carbatrol 200 MG 12 hr capsule Generic drug:  carbamazepine TAKE 1 CAPSULE BY MOUTH EVERY DAY IN THE EVENING   Carbatrol 300 MG 12 hr capsule Generic drug: carbamazepine TAKE 1 CAPSULE BY MOUTH EVERY DAY IN THE MORNING   clonazePAM 1 MG tablet Commonly known as: KLONOPIN Take by mouth.   diazepam 2 MG tablet Commonly known as: VALIUM Give 1 tablet 30 minutes before bedtime.   famotidine 20 MG tablet Commonly known as: PEPCID Take 20 mg by mouth at bedtime as needed.   nystatin powder Commonly known as: MYCOSTATIN/NYSTOP Apply 1 application topically 2 (two) times daily.   pantoprazole 40 MG tablet Commonly known as: PROTONIX Take 40 mg by mouth daily.   polyethylene glycol 17 g packet Commonly known as: MIRALAX / GLYCOLAX Mix one capful in 8oz of fluid daily      Total time spent with the patient was 15 minutes, of which 50% or more was spent in counseling and coordination of care.  Ebony Rising NP-C [facility] [address] [place], [place] [address] Ph. [phone] Fax [phone]

## 2023-11-23 NOTE — Patient Instructions (Signed)
It was a pleasure to see you today!  Instructions for you until your next appointment are as follows: Continue giving Ebony Young's medications as prescribed Let me know if any seizures occur or if you have any questions or concerns. Please sign up for MyChart if you have not done so. Please plan to return for follow up in 1 year or sooner if needed.  Feel free to contact our office during normal business hours at [phone] with questions or concerns. If there is no answer or the call is outside business hours, please leave a message and our clinic staff will call you back within the next business day.  If you have an urgent concern, please stay on the line for our after-hours answering service and ask for the on-call neurologist.     I also encourage you to use MyChart to communicate with me more directly. If you have not yet signed up for MyChart within [facility], the front desk staff can help you. However, please note that this inbox is NOT monitored on nights or weekends, and response can take up to 2 business days.  Urgent matters should be discussed with the on-call pediatric neurologist.   At [facility], we are committed to providing exceptional care. You will receive a patient satisfaction survey through text or email regarding your visit today. Your opinion is important to me. Comments are appreciated.

## 2024-08-09 ENCOUNTER — Ambulatory Visit

## 2024-08-23 ENCOUNTER — Ambulatory Visit: Attending: Internal Medicine | Admitting: Physical Therapy

## 2024-08-23 ENCOUNTER — Encounter: Payer: Self-pay | Admitting: Physical Therapy

## 2024-08-23 ENCOUNTER — Other Ambulatory Visit: Payer: Self-pay

## 2024-08-23 DIAGNOSIS — G800 Spastic quadriplegic cerebral palsy: Secondary | ICD-10-CM | POA: Diagnosis present

## 2024-08-23 DIAGNOSIS — R293 Abnormal posture: Secondary | ICD-10-CM | POA: Insufficient documentation

## 2024-08-23 DIAGNOSIS — R260 Ataxic gait: Secondary | ICD-10-CM | POA: Diagnosis present

## 2024-08-23 DIAGNOSIS — R29898 Other symptoms and signs involving the musculoskeletal system: Secondary | ICD-10-CM | POA: Insufficient documentation

## 2024-08-23 DIAGNOSIS — R200 Anesthesia of skin: Secondary | ICD-10-CM | POA: Diagnosis present

## 2024-08-23 DIAGNOSIS — R29818 Other symptoms and signs involving the nervous system: Secondary | ICD-10-CM | POA: Insufficient documentation

## 2024-08-23 NOTE — Therapy (Signed)
 OUTPATIENT PHYSICAL THERAPY WHEELCHAIR EVALUATION   Patient Name: Ebony Young MRN: [id number] DOB:Aug 28, 1996, 28 y.o., female Today's Date: 08/23/2024  END OF SESSION:  PT End of Session - 08/23/24 0849     Visit Number 1    Number of Visits 1    Date for PT Re-Evaluation 08/23/24    Authorization Type [employer] Primary; Medicaid Secondary    PT Start Time 0846    PT Stop Time 0925    PT Time Calculation (min) 39 min    Activity Tolerance Patient tolerated treatment well    Behavior During Therapy Restless;WFL for tasks assessed/performed          Past Medical History:  Diagnosis Date   Quadriparesis (HCC)    Seizures (HCC)    Past Surgical History:  Procedure Laterality Date   MYRINGOTOMY WITH TUBE PLACEMENT     Patient Active Problem List   Diagnosis Date Noted   Agitation 12/17/2015   Generalized convulsive epilepsy (HCC) 06/13/2013   Congenital quadriplegia (HCC) 06/13/2013   Microcephalus (HCC) 06/13/2013   Severe developmental delay 06/13/2013    PCP: Vernadine Charlie ORN, MD  REFERRING PROVIDER: Tisovec, Richard W, MD  THERAPY DIAG:  Spastic quadriplegic cerebral palsy (HCC)  Other symptoms and signs involving the musculoskeletal system  Other symptoms and signs involving the nervous system  Abnormal posture  Anesthesia of skin  Ataxic gait  Rationale for Evaluation and Treatment Rehabilitation  SUBJECTIVE:                                                                                                                                                                                           SUBJECTIVE STATEMENT: Pt presents for wheelchair evaluation in current stroller style seated mobility w/ dad present for evaluation providing all information on behalf of the patient. She is dependent for all mobility including limited step transfers w/ dad endorsing pt sometimes gives out in standing prior to completion of transfers.  Dad denies any falls.   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  PRECAUTIONS: Fall  RED FLAGS: Bowel or bladder incontinence: Yes: wears briefs  WEIGHT BEARING RESTRICTIONS No   PLOF:  Needs assistance with ADLs, Needs assistance with homemaking, and Needs assistance with transfers  PATIENT GOALS: To obtain manual wheeled mobility to allow for safe dependent pressure relief, safe dependent mobility, and ability to complete MRADLs in a safe and timely manner      MEDICAL HISTORY:  Primary diagnosis onset: Congenital     Medical Diagnosis with ICD-10 code: G80.0 - Spastic Quadriplegic Cerebral Palsy   []   Progressive disease  Relevant future surgeries:   N/A  Height: 5 ft Weight: 110 lbs Explain recent changes or trends in weight:  N/A    History:  Past Medical History:  Diagnosis Date   Quadriparesis (HCC)    Seizures (HCC)        Cardio Status:  Functional Limitations:   [x] Intact  []  Impaired      Respiratory Status:  Functional Limitations:   [x] Intact  [] Impaired   [] SOB [] COPD [] O2 Dependent ______LPM  [] Ventilator Dependent  Resp equip:                                                     Objective Measure(s): N/A  Orthotics: Bilateral UE splints for arm protection; Bilateral AFOs  [] Amputee:              N/A                                               [] Prosthesis:  N/A       HOME ENVIRONMENT:  [x] House [] Condo/town home [] Apartment [] Asst living [] LTCF         [] Own  [] Rent   [] Lives alone [x] Lives with others -        family                     Hours without assistance: 0  [x] Home is accessible to patient                                 Storage of wheelchair:  [x] In home   [] Other Comments:        COMMUNITY :  TRANSPORTATION:  [] Car [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  [] [employer]  [x]  [employer]   r   [] Self-Driver       Drive while in  Biomedical scientist [] yes [x] no   Employment and/or school:  Specific requirements pertaining to mobility        Other:   COMMUNICATION:  Verbal Communication  [] WFL [] receptive [] WFL [] expressive [] Understandable  [] Difficult to understand  [x] non-communicative  Primary Language:_______English_______ 2nd:_____________  Communication provided by:[] Patient [x] Family [] Caregiver [] Translator   [] Uses an augmentative communication device     Manufacturer/Model :                                                                MOBILITY/BALANCE:  Sitting Balance  Standing Balance  Transfers  Ambulation   [] WFL      [] WFL  [] Independent  []  Independent   [] Uses UE for balance in sitting Comments:  [] Uses UE/device for stability Comments:  []  Min assist  []  Ambulates independently with       device:___________________      []   Mod assist  []  Able to ambulate ______ feet        safely/functionally/independently   []  Min assist  []  Min assist  []  Max assist  []  Non-functional ambulator         History/High risk of falls   []  Mod assist  []  Mod assist  [x]  Dependent  [x]  Unable to ambulate   [x]  Max  assist  [x]  Max assist  Transfer method:[] 1 person [] 2 person [] sliding board [] squat pivot [] stand pivot [] mechanical patient lift  [] other:   []  Unable  []  Unable    Fall History: # of falls in the past 6 months? 0 # of "near" falls in the past 6 months? 0    CURRENT SEATING / MOBILITY:  Current Mobility Device: [] None [] Cane/Walker [x] Manual [] Dependent [] Dependent w/ Tilt rScooter  [] Power (type of control):   Manufacturer:  Model:  Serial #:   Size:  Color: Black and red Age: 67 years  Purchased by whom: patient and insurance  Current condition of mobility base:  Shows age related wear and tear  Current seating system:                                                                       Age of seating system:  8 years  Describe posture in present seating  system: Kyphotic scoliosis, right pelvic obliquity   Is the current mobility meeting medical necessity?:  [] Yes [x] No Describe: Patient's current stroller is at least 27 years old and shows age related wear and tear.  Patient is dependent for all MRADLs and as such requires a device that better positions her for transfers and ease of caregiving.  She is at high risk of skin breakdown and current mobility system does not provide most optimal positioning options to decrease the risk of wound development.  Patient has increased spasticity and stimming behaviors that impact safe positioning in current seated mobility for skin integrity and transfer management.  This chair does not currently meet the patient's medical needs.                                    Ability to complete Mobility-Related Activities of Daily Living (MRADL's) with Current Mobility Device:   Move room to room  [] Independent  [] Min [] Mod [x] Max assist  [] Unable  Comments:   Meal prep  [] Independent  [] Min [] Mod [x] Max assist  [] Unable    Feeding  [] Independent  [] Min [] Mod [x] Max assist  [] Unable    Bathing  [] Independent  [] Min [] Mod [x] Max assist  [] Unable    Grooming  [] Independent  [] Min [] Mod [x] Max assist  [] Unable    UE dressing  [] Independent  [] Min [] Mod [x] Max assist  [] Unable    LE dressing  [] Independent   [] Min [] Mod [x] Max assist  [] Unable    Toileting  [] Independent  [] Min [] Mod [x] Max assist  [] Unable    Bowel Mgt: []  Continent [x]  Incontinent []  Accidents [x]  Diapers []  Colostomy []  Bowel Program:  Bladder Mgt: []  Continent [x]  Incontinent []  Accidents [x]  Diapers []  Urinal []  Intermittent Cath []  Indwelling Cath []  Supra-pubic Cath     Current Mobility Equipment Trialed/ Ruled Out:  Does not meet mobility needs due to:    Oneil all boxes that indicate inability to use the specific equipment listed     Meets needs for safe  independent functional  ambulation  / mobility    Risk of  Falling or History of  Falls    Enviromental limitations      Cognition    Safety concerns with  physical ability    Decreased / limitations endurance  & strength     Decreased / limitations  motor skills  & coordination    Pain    Pace /  Speed    Cardiac and/or  respiratory condition    Contra - indicated by diagnosis   Cane/Crutches  []   []   []   []   []   []   []   []   []   []   [x]    Walker / Rollator  []  NA   []   []   []   []   []   []   []   []   []   []   [x]     Manual Wheelchair [id number]:  []  NA  []   []   []   []   []   []   []   []   []   []   [x]    Manual W/C (K0005) with power assist  []  NA  []   []   []   []   []   []   []   []   []   []   [x]    Scooter  []  NA  []   []   []   []   []   []   []   []   []   []   [x]    Power Wheelchair: standard joystick  []  NA  []   []   []   []   []   []   []   []   []   []   [x]    Power Wheelchair: alternative controls  []  NA  []   []   []   []   []   []   []   []   []   []   [x]    Summary:  The least costly alternative for independent functional mobility was found to be:    []  Crutch/Cane  []  Walker [x]  Manual w/c  []  Manual w/c with power assist   []  Scooter   []  Power w/c std joystick   []  Power w/c alternative control        [x]  Requires dependent care mobility Market researcher for [employer] skills are adequate for safe mobility equipment operation  []   Yes [x]   No  Patient is willing and motivated to use recommended mobility equipment  [x]   Yes []   No      [x]  Patient is unable to safely operate mobility equipment independently and requires dependent care equipment Comments: Due to congenital diagnosis of spastic quadriplegic Cerebral Palsy, patient is unable to sustain attention to task, initiate volitional movement required to operate mobility device independently, and is unable to maintain safe seated position in order to independently maneuver mobility device.  She is also limited in bilateral upper extremity use for all other mobility options as she requires splinting  to protect from self-harm stimming behaviors.          SENSATION and SKIN ISSUES:  Sensation []  Intact  [x]  Impaired []  Absent []  Hyposensate []  Hypersensate  []  Defensiveness  Location(s) of impairment:    Pressure Relief Method(s):  []  Lean side to side to offload (without risk of falling)  []   W/C push up (4+ times/hour for 15+ seconds) []  Stand up (without risk of falling)    [x]  Other: (  Describe):  Pt dependent on caregiver for standing transfer or hoyer to reposition for pressure relief. Effective pressure relief method(s) above can be performed consistently throughout the day: [x] Yes  []  No If not, Why?:  Skin Integrity Risk:       []  Low risk           []  Moderate risk            [x]  High risk  If high risk, explain: Pt is unable to communicate episodes of incontinence and is at risk for prolonged skin irritation.  She is unable to manually complete pressure relief relying on caregivers to perform necessary standing and position changes to maintain skin integrity.  Skin Issues/Skin Integrity  Current skin Issues  []  Yes [x]  No []  Intact  []   Red area   []   Open area  []  Scar tissue  [x]  At risk from prolonged sitting  Where:  Ischium, sacrum, BLE History of Skin Issues  []  Yes [x]  No Where :  N/A When:  N/A Stage:  N/A Hx of skin flap surgeries  []  Yes [x]  No Where:  N/A When:  N/A  Pain: []  Yes [x]  No   Pain Location(s): N/A Intensity scale: (0-10) :  N/A How does pain interfere with mobility and/or MRADLs? - pt unable to indicate pain        MAT EVALUATION:  Neuro-Muscular Status: (Tone, Reflexive, Responses, etc.)     []   Intact   [x]  Spasticity: quadriplegic []  Hypotonicity  []  Fluctuating  []  Muscle Spasms  [x]  Poor Righting Reactions/Poor Equilibrium Reactions  []  Primal Reflex(s):    Comments:           COMMENTS:   POSTURE:     Comments:   Pelvis Anterior/Posterior:  []  Neutral   [x]  Posterior  []  Anterior  []  Fixed - No movement [x]   Tendency away from neutral [x]  Flexible []  Self-correction [x]  External correction Obliquity (viewed from front)  []  WFL [x]  R Obliquity []  L Obliquity  []  Fixed - No movement [x]  Tendency away from neutral []  Flexible []  Self-correction []  External correction Rotation  []  WFL []  R anterior []  L anterior  []  Fixed - No movement [x]  Tendency away from neutral []  Flexible []  Self-correction []  External correction Tonal Influence Pelvis:  []  Normal []  Flaccid []  Low tone [x]  Spasticity []  Dystonia [x]  Pelvis thrust []  Other:    Trunk Anterior/Posterior:  []  WFL [x]  Thoracic kyphosis []  Lumbar lordosis  []  Fixed - No movement []  Tendency away from neutral []  Flexible []  Self-correction []  External correction  []  WFL []  Convex to left  []  Convex to right []  S-curve   [x]  C-curve []  Multiple curves []  Tendency away from neutral []  Flexible []  Self-correction []  External correction Rotation of shoulders and upper trunk:  [x]  Neutral []  Left-anterior []  Right- anterior []  Fixed- no movement []  Tendency away from neutral []  Flexible []  Self correction []  External correction Tonal influence Trunk:  [x]  Normal []  Flaccid []  Low tone []  Spasticity []  Dystonia []  Other:   Head & Neck  []  Functional [x]  Flexed    []  Extended []  Rotated right  []  Rotated left []  Laterally flexed right []  Laterally flexed left []  Cervical hyperextension   []  Good head control [x]  Adequate head control []  Limited head control []  Absent head control Describe tone/movement of head and neck: Pt demonstrates frequent rocking movements creating head and pelvic thrust in sitting.     Lower Extremity Measurements:  LE ROM:  Passive ROM Right 08/23/2024 Left 08/23/2024  Hip flexion Assessment limited by difficulty following commands and intolerance to handling.  Hip extension   Hip abduction   Hip adduction   Knee flexion   Knee extension   Ankle dorsiflexion   Ankle  plantarflexion    (Blank rows = not tested)  LE MMT:  MMT Right 08/23/2024 Left 08/23/2024  Hip flexion Assessment limited by difficulty following commands and intolerance to handling.  Hip extension   Hip abduction   Hip adduction   Knee flexion   Knee extension   Ankle dorsiflexion   Ankle plantarflexion    (Blank rows = not tested)  Hip positions:  []  Neutral   []  Abducted   []  Adducted  []  Subluxed   []  Dislocated   []  Fixed   [x]  Tendency away from neutral []  Flexible []  Self-correction []  External correction   Hip Windswept:[]  Neutral  [x]  Right    []  Left  []  Subluxed   []  Dislocated   []  Fixed   []  Tendency away from neutral []  Flexible []  Self-correction []  External correction  LE Tone: []  Normal []  Low tone [x]  Spasticity []  Flaccid []  Dystonia [x]  Rocks/Extends at hip []  Thrust into knee extension []  Pushes legs downward into footrest  Foot positioning: ROM Concerns: Dorsiflexed: []  Right   []  Left Plantar flexed: []  Right    []  Left Inversion: []  Right    []  Left Eversion: [x]  Right    [x]  Left  UE Measurements:  UPPER EXTREMITY ROM:   Passive ROM Right 08/23/2024 Left 08/23/2024  Shoulder flexion Assessment limited by difficulty following commands and intolerance to handling.  Shoulder abduction   Shoulder adduction   Elbow flexion   Elbow extension   Wrist flexion   Wrist extension   (Blank rows = not tested)  UPPER EXTREMITY MMT:  MMT Right 08/23/2024 Left 08/23/2024  Shoulder flexion Assessment limited by difficulty following commands and intolerance to handling.  Shoulder abduction   Shoulder adduction   Elbow flexion   Elbow extension   Wrist flexion   Wrist extension   Pinch strength   Grip strength   (Blank rows = not tested)  Shoulder Posture:  Right Tendency towards Left  []   Functional []    []   Elevation []    []   Depression []    [x]   Protraction [x]    []   Retraction []    []   Internal rotation []    []   External  rotation []    []   Subluxed []     UE Tone: []  Normal []  Flaccid []  Low tone [x]  Spasticity  []  Dystonia []  Other:   Wrist/Hand: Handedness: []  Right   []  Left   [x]  NA: Comments:  Right  Left  []   WNL []    []   Limitations []    []   Contractures []    []   Fisting []    []   Tremors []    []   Weak grasp []    []   Poor dexterity []    []   Hand movement non functional []    []   Paralysis []         MOBILITY BASE RECOMMENDATIONS and JUSTIFICATION:  MOBILITY BASE  JUSTIFICATION   Manufacturer:   Ki Mobility Model:       Ark                       Color: Purple and Black Seat Width:  16 in Seat Depth:  18 in   [  x] Manual mobility base (continue below)   []  Scooter/POV  []  Power mobility base   Number of hours per day spent in above selected mobility base: 16 hrs/day  Typical daily mobility base use Schedule: Patient is non-ambulatory and will utilize manual mobility with caregiver assistance to perform all MRADLs in the home.  It will allow for improved positioning for skin integrity and tone facilitation as well as dependent transfer safety and efficiency. Caregivers are able to provide pressure relief throughout the day with dependent stand and features of manual mobility device.   [x]  is not a safe, functional ambulator  []  limitation prevents from completing a MRADL(s) within a reasonable time frame    [x]  limitation places at high risk of morbidity or mortality secondary to  the attempts to perform a    MRADL(s)  [x]  limitation prevents accomplishing a MRADL(s) entirely  []  provide independent mobility  [x]  equipment is a lifetime medical need  [x]  walker or cane inadequate  [x]  any type manual wheelchair      inadequate  [x]  scooter/POV inadequate      [x]  requires dependent mobility          MANUAL MOBILITY      []  Standard manual wheelchair  K0001      Arm:    []  both []  right  []  left      Foot:   []  both []  right   []  left  []  self-propels wheelchair  []  will use  on regular basis  []  chair fits throughout home  []  willing and motivated to use  []  propels with assistance     []  dependent use   []  Standard hemi-manual wheelchair  K0002      Arm:    []  both []  right  []  left      Foot:   []  both []  right   []  left  []  lower seat height required to foot propel  []  short stature  []  self-propels wheelchair  []  will use on regular basis  []  chair fits throughout home  []  willing and motivated to use   []  propels with assistance  []  dependent use   []  Lightweight manual wheelchair  K0003      Arm:    []  both []  right  []  left      Foot:   []  both  []  right  []  left                   []  hemi height required  []  medical condition and weight of  wheelchair affect ability to self      propel standard manual wheelchair in the residence  []  can and does self-propel (marginal propulsion skills)  []  daily use _________hours  []  chair fits throughout home  []  willing and motivated to use  []  lower seat height required to foot propel  []  short stature   []  High strength lightweight manual  wheelchair (Breezy Ultra 4)  K0004     Arm:    []  both []  right  []  left     Foot:   []  both []  right   []  left                                                                  []   hemi height required []  medical condition and weight of wheelchair affect ability to self propel while engaging in frequent MRADL(s) that cannot be performed in a standard or lightweight manual wheelchair  []  daily use _________hours  []  chair fits throughout home  []  willing and motivated to use  []  prevent repetitive use injuries   []  lower seat height required to foot propel  []  short stature    []  Ultra-lightweight manual wheelchair  K0005     Arm:    []  both []  right  []  left     Foot:   []  both []  right  []  left       []  hemi height required  []  heavy duty    Front seat to floor _____ inches      Rear seat to floor _____ inches      Back height _____ inches     Back angle  ______ degrees      Front angle _____ degrees  []   full-time manual wheelchair user  []  Requires individualized fitting and optimal adjustments for multiple features that include adjustable axle configuration, fully adjustable center of gravity, wheel camber, seat and back angle, angle of seat slope, which cannot be accommodated by a K0001 through K0004 manual wheelchair  []  prevent repetitive use injuries  []  daily use_________hours   []  user has high activity patterns that frequently require  them  to go out into the community for the purpose of independently accomplishing high level MRADL activities. Examples of these might include a combination of; shopping, work, school, Photographer, childcare, independently loading and unloading from a vehicle etc.  []  lower seat height required to foot propel  []  short stature  []  heavy duty -  weight over 250lbs   []  Current chair is a K0005   manufacture:___________________  model:_________________  serial#____________________  age:_________    []  First time X9994 user (complete trial)  K0004 time and # of strokes to propel 30 feet: ________seconds _________strokes  X9994 time and # of strokes to propel 30 feet: ________seconds _________strokes  What was the result of the trial between the K0004 and K0005 manual wheelchair? ___    What features of the K0005 w/c are needed as compared to the K0004 base? Why?___    []  adjustable seat and back angle changes the angle of seat slope of the frame to attain a gravity assisted position for efficient propulsion and proper weight distribution along the frame     []  the front of the wheelchair will be configured higher than the back of the chair to allow gravity to assist the user with postural stability  []  the center of the wheel will be positioned for stability, safety and efficient propulsion  []  adjustable axle allows for vertical, horizontal, camber and overall width changes  throughout the wheels for  adjustment of the client's exact needs and abilities.   []  adjustable axle increases the stability and function of the chair allowing for adjustment of the center of gravity.   []  accommodates the client's anatomical position in the chair maximizing independence in mobility and maneuverability in all environments.   []  create a minimal fixed tilt-in space to assist in positioning.   []  Describe users full-time manual wheelchair activity patterns:___    []  Power assist Comments:  []  prevent repetitive use injuries  []  repetitive strain injury present in    shoulder girdle    []  shoulder pain is (> or =) to 7/10     during  manual propulsion       Current Pain _____/10  []  requires conservation of energy to participate in MRADL(s) runable to propel up ramps or curbs using manual wheelchair  []  been K0005 user greater than one year  []  user unwilling to use power      wheelchair (reason): []  less expensive option to power   wheelchair   []  rim activated power assist -      decreased strength   []  Heavy duty manual wheelchair       K0006     Arm:    []  both []  right  []  left     Foot:   []  both []  right  []  left     []  hemi height required    []  Dependent base  []  user exceeds 250lbs  []  non-functional ambulator    []  extreme spasticity  []  over active movement   []  broken frame/hx of repeated     repairs  []  able to self-propel in residence       []  lower seat to floor height required  []  unable to self-propel in residence   []  Extra heavy duty manual wheelchair  K0007     Arm:    []  both []  right  []  left     Foot:   []  both []  right  []  left     []  hemi height required  []  Dependent base  []  user exceeds 300lbs  []  non-functional ambulator    []  able to self-propel in residence   []  lower seat to floor height required  []  unable to self-propel in residence     [x]  Manual wheelchair with tilt [phone]      (Manual "Tilt-n-Space")  [x]  patient is dependent for transfers  [x]   patient requires frequent       positioning for pressure relief   [x]  patient requires frequent      positioning for poor/absent trunk control        []  Stroller Base  []  infant/child   []  unable to propel manual      wheelchair  []  allows for growth  []  non-functional ambulator  []  non-functional UE  []  independent mobility is not a goal at this time    MANUAL FRAME OPTIONS      Push handles  []  extended   [x]  angle adjustable   []  standard  [x]  caregiver access  [x]  caregiver assist    []  allows "hooking" to enable      increased ability to perform ADLs or maintain balance   []  Angle Adjustable Back  []  postural control  []  control of tone/spasticity  []  accommodation of range of motion  []  UE functional control  []  accommodation for seating system    Rear wheel placement  []  std/fixed  [] fully adjustableramputee   []  camber ________degree  [x]  removable rear wheel  []  non-removable rear wheel  Wheel size ___12 in____  Wheel style___________Mag____________  []  improved UE access to wheels  []  increase propulsion ability  [x]  improved stability  []  changing angle in space for      improvement of postural stability  [x]  remove for transport    []  allow for seating system to fit on  base []  amputee placement []  1-arm drive access   r R  r L  []  enable propulsion of manual       wheelchair with one arm []  amputee placement  Wheel rims/ Hand rims  []  Standard    []   Specialized-____ []  provide ability to propel manual   []  increase self-propulsion with hand wheelchair weakness/decreased grasp     []  Spoke protector/guard   []  prevent hands from getting caught in spokes   Tires:  [x]  pneumatic  [x]  flat free inserts  []  solid  Style:  []  decrease roll resistance              [x]  prevent frequent flats  []  increase shock absorbency  [x]  decrease maintenance   []  decrease pain from road shock    []  decrease spasms from road shock    Wheel Locks:    [x]  push []  pull []   scissor  [x]  lock wheels for transfers  [x]  lock wheels from rolling   Brake/wheel lock extension:  []  R  []  L  []  allow user to operate wheel locks due to decreased reach or strength   Caster housing:  Materials engineer size:                      Style:                                          []  suspension fork  []  maneuverability   []  stability of wheelchair   []  durability  []  maintenance  []  angle adjustment for posture  []  allow for feet to come under        wheelchair base  []  allows change in seat to floor  height   []  increase shock absorbency  []  decrease pain from road shock  []  decrease spasms from road    shock   []  Side guards  []  prevent clothing getting caught in wheel or becoming soiled   [] provide hip and pelvic stability  []  eliminates contact between body and wheels  []  limit hand contact with wheels   [x]  Anti-tippers      [x]  prevent wheelchair from tipping    backward  [x]  assist caregiver with curbs     POWER MOBILITY      []  Scooter/POV    []  can safely operate   []  can safely transfer   []  has adequate trunk stability   []  cannot functionally propel  manual wheelchair    []  Power mobility base    []  non-ambulatory   []  cannot functionally propel manual wheelchair   []  cannot functionally and safely      operate scooter/POV  []  can safely operate power       wheelchair  []  home is accessible  []  willing to use power wheelchair     Tilt  []  Powered tilt on powered chair  []  Powered tilt on manual chair  [x]  Manual tilt on manual chair Comments:  [x]  change position for pressure      [x]  elief/cannot weight shift   [x]  change position against      gravitational force on head and      shoulders   []  decrease pain  []  blood pressure management   []  control autonomic dysreflexia  []  decrease respiratory distress  [x]  management of spasticity  []  management of low tone  [x]  facilitate postural control   [x]  rest periods   []  control edema  [x]  increase  sitting tolerance   [x]  aid with transfers     Recline   []  Power recline on power chair  [x]  Manual recline on manual  chair  Comments:    []  intermittent catheterization  [x]  manage spasticity  [x]  accommodate femur to back angle  [x]  change position for pressure relief/cannot weight shift rhigh risk of pressure sore development  []  tilt alone does not accomplish     effective pressure relief, maximum pressure relief achieved at -      _______ degrees tilt   _______ degrees recline   [x]  difficult to transfer to and from bed []  rest periods and sleeping in chair  [x]  repositioning for transfers  [x]  bring to full recline for ADL care  [x]  clothing/diaper changes in chair  []  gravity PEG tube feeding  []  head positioning  []  decrease pain  []  blood pressure management   []  control autonomic dysreflexia  []  decrease respiratory distress  []  user on ventilator     Elevator on mobility base  []  Power wheelchair  []  Scooter  []  increase Indep in transfers   []  increase Indep in ADLs    []  bathroom function and safety  []  kitchen/cooking function and safety  []  shopping  []  raise height for communication at standing level  []  raise height for eye contact which reduces cervical neck strain and pain  []  drive at raised height for safety and navigating crowds  []  Other:   []  Vertical position system  (anterior tilt)     (Drive locks-out)    []  Stand       (Drive enabled)  []  independent weight bearing  []  decrease joint contractures  []  decrease/manage spasticity  []  decrease/manage spasms  []  pressure distribution away from   scapula, sacrum, coccyx, and ischial tuberosity  []  increase digestion and elimination   []  access to counters and cabinets  []  increase reach  []  increase interaction with others at eye level, reduces neck strain  []  increase performance of       MRADL(s)      Power elevating legrest    []  Center mount (Single) 85-170 degrees       []   Standard (Pair) 100-170 degrees  []  position legs at 90 degrees, not available with std power ELR  []  center mount tucks into chair to decrease turning radius in home, not available with std power ELR  []  provide change in position for LE  []  elevate legs during recline    []  maintain placement of feet on      footplate  []  decrease edema  []  improve circulation  []  actuator needed to elevate legrest  []  actuator needed to articulate legrest preventing knees from flexing  []  Increase ground clearance over      curbs  []   STD (pair) independently                     elevate legrest   POWER WHEELCHAIR CONTROLS      Controls/input device  []  Expandable  []  Non-expandable  []  Proportional  []  Right Hand []  Left Hand  []  Non-proportional/switches/head-array  []  Electrical/proximity         []   Mechanical      Manufacturer:___________________   Type:________________________ []  provides access for controlling wheelchair  []  programming for accurate control  []  progressive disease/changing condition  []  required for alternative drive      controls       []  lacks motor control to operate  proportional drive control  []  unable to understand proportional controls  []  limited movement/strength  []  extraneous movement / tremors / ataxic / spastic       []   Upgraded electronics controller/harness    []  Single power (tilt or recline)   []  Expandable    []  Non-expandable plus   []  Multi-power (tilt, recline, power legrest, power seat lift, vertical positioning system, stand)  []  allows input device to communicate with drive motors  []  harness provides necessary connections between the controller, input device, and seat functions     []  needed in order to operate power seat functions through joystick/ input device  []  required for alternative drive controls     []  Enhanced display  []  required to connect all alternative drive controls   []  required for upgraded joystick      (lite-throw,  heavy duty, micro)  []  Allows user to see in which mode and drive the wheelchair is set; necessary for alternate controls       []  Upgraded tracking electronics  []  correct tracking when on uneven surfaces makes switch driving more efficient and less fatiguing  []  increase safety when driving  []  increase ability to traverse thresholds    []  Safety / reset / mode switches     Type:    []  Used to change modes and stop the wheelchair when driving     []  Mount for joystick / input device/switches  []  swing away for access or transfers   []  attaches joystick / input device / switches to wheelchair   []  provides for consistent access  []  midline for optimal placement    []  Attendant controlled joystick plus     mount  []  safety  []  long distance driving  []  operation of seat functions  []  compliance with transportation regulations    []  Battery  []  required to power (power assist / scooter/ power wc / other):   []  Power inverter (24V to 12V)  []  required for ventilator / respiratory equipment / other:     CHAIR OPTIONS MANUAL & POWER      Armrests   [x]  adjustable height [x]  removable  []  swing away []  fixed  []  flip back  []  reclining  [x]  full length pads []  desk []  tube arms []  gel pads  [x]  provide support with elbow at 90    [x]  remove/flip back/swing away for  transfers  [x]  provide support and positioning of upper body    []  allow to come closer to table top  [x]  remove for access to tables  [x]  provide support for w/c tray  [x]  change of height/angles for variable activities   []  Elbow support / Elbow stop  []  keep elbow positioned on arm pad  []  keep arms from falling off arm pad  during tilt and/or recline   Upper Extremity Support  []  Arm trough  []   R  []   L  Style:  []  swivel mount []  fixed mount   []  posterior hand support  []   tray  [x]  full tray  []  joystick cut out  []   R  []   L  Style: padded []  decrease gravitational pull on      shoulders  [x]  provide support  to increase UE  function  [x]  provide hand support in natural    position  []  position flaccid UE  []  decrease subluxation    []  decrease edema       [x]  manage spasticity   [x]  provide midline positioning  [x]  provide work surface  []  placement for AAC/ Computer/ EADL       Hangers/ Legrests   []  ______ degree  []   Elevating []  articulating  []  swing away []  fixed []  lift off  []  heavy duty  []  adjustable knee angle  []  adjustable calf panel   []  longer extension tube              []  provide LE support  []  maintain placement of feet on      footplate   []  accommodate lower leg length  []  accommodate to hamstring       tightness  []  enable transfers  []  provide change in position for LE's  []  elevate legs during recline    []  decrease edema  []  durability      Foot support   [x]  footplate [x]  R [x]  L [x]  flip up           []  Depth adjustable   [x]  angle adjustable  []  foot board/one piece    [x]  provide foot support  [x]  accommodate to ankle ROM  []  allow foot to go under wheelchair base  [x]  enable transfers     []  Shoe holders  []  position foot    []  decrease / manage spasticity  []  control position of LE  []  stability    []  safety     [x]  Ankle strap/heel      loops  [x]  support foot on foot support  [x]  decrease extraneous movement  []  provide input to heel   [x]  protect foot     []  Amputee adapter []  R  []  L     Style:                  Size:  []  Provide support for stump/residual extremity    [x]  Transportation tie-down  [x]  to provide crash tested tie-down brackets    []  Crutch/cane holder    []  O2 holder    []  IV hanger   []  Ventilator tray/mount    []  stabilize accessory on wheelchair       Component  Justification     [x]  Seat cushion     ACTA Embrace Anti-thrust [x]  accommodate impaired sensation  []  decubitus ulcers present or history  [x]  unable to shift weight  [x]  increase pressure distribution  [x]  prevent pelvic extension  []  custom required  "off-the-shelf"    seat cushion will not accommodate deformity  [x]  stabilize/promote pelvis alignment  [x]  stabilize/promote femur alignment  [x]  accommodate obliquity  [x]  accommodate multiple deformity  [x]  incontinent/accidents  [x]  low maintenance     []  seat mounts                 []  fixed []  removable  []  attach seat platform/cushion to wheelchair frame    []  Seat wedge    []  provide increased aggressiveness of seat shape to decrease sliding  down in the seat  []  accommodate ROM        []  Cover replacement   []  protect back or seat cushion  []  incontinent/accidents    []  Solid seat / insert    []  support cushion to prevent      hammocking  []  allows attachment of cushion to mobility base    []  Lateral pelvic/thigh/hip     support (Guides)     []  decrease abduction  []  accommodate pelvis  []  position upper legs  []  accommodate spasticity  []  removable for transfers     []  Lateral pelvic/thigh      supports mounts  []  fixed   []  swing-away   []  removable  []   mounts lateral pelvic/thigh supports     []  mounts lateral pelvic/thigh supports swing-away or removable for transfers    []  Medial thigh support ([place]mel)  [] decrease adduction  [] accommodate ROM  []  remove for transfers   []  alignment      []  Medial thigh   []  fixed      support mounts      []  swing-away   []  removable  []  mounts medial thigh supports   []  Mounts medial supports swing- away or removable for transfers       Component  Justification   [x]  Back    ACTA Deep   [x]  provide posterior trunk support [x]  facilitate tone  [x]  provide lumbar/sacral support []  accommodate deformity  [x]  support trunk in midline   []  custom required "off-the-shelf" back support will not accommodate deformity   [x]  provide lateral trunk support []  accommodate or decrease tone            [x]  Back mounts  []  fixed  [x]  removable  [x]  attach back rest/cushion to wheelchair frame   []  Lateral trunk      supports  []  R []  L  []   decrease lateral trunk leaning  []  accommodate asymmetry    []  contour for increased contact  []  safety    []  control of tone    []  Lateral trunk      supports mounts  []  fixed  []  swing-away   []  removable  []  mounts lateral trunk supports     []  Mounts lateral trunk supports swing-away or removable for transfers   [x]  Anterior chest      strap, vest     []  decrease forward movement of shoulder  [x]  decrease forward movement of trunk  [x]  safety/stability  []  added abdominal support  [x]  trunk alignment  []  assistance with shoulder control   []  decrease shoulder elevation    [x]  Headrest      [x]  provide posterior head support  [x]  provide posterior neck support  []  provide lateral head support  []  provide anterior head support  [x]  support during tilt and recline  []  improve feeding     []  improve respiration  []  placement of switches  [x]  safety    [x]  accommodate ROM   [x]  accommodate tone  []  improve visual orientation   [x]  Headrest           []  fixed [x]  removable []  flip down      Mounting hardware   []  swing-away laterals/switches  [x]  mount headrest   [x]  mounts headrest flip down or  removable for transfers  []  mount headrest swing-away laterals   []  mount switches     []  Neck Support    []  decrease neck rotation  []  decrease forward neck flexion   Pelvic Positioner    []  std hip belt          [x]  padded hip belt  []  dual pull hip belt  []  four point hip belt  [x]  stabilize tone  [x]  decrease falling out of chair  [x]  prevent excessive extension  []  special pull angle to control      rotation  [x]  pad for protection over boney   prominence  [x]  promote comfort    []  Essential needs        bag/pouch   []  medicines []  special food rorthotics []  clothing changes  []  diapers  []  catheter/hygiene []  ostomy supplies   The above equipment has a life-  long use expectancy.  Growth and changes in medical and/or functional conditions would be the exceptions.    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 Based on the dependent nature of her transfers and performance of MRADLs due to impaired cognition, stimming behaviors, and impaired volitional UE and LE use needed to propel a device independently she requires a manual chair that is custom fit to provide back and trunk support as she has impaired tone and postural instability. Additionally, as a full-time wheelchair user she needs the chair to be custom fit to her size to allow for optimal dependent propulsion that is energy-efficient and decreases the strain of dependent transfers, pressure relief, and positioning.   ASSESSMENT:  CLINICAL IMPRESSION: Patient is a 28 y.o. female who was seen today for physical therapy evaluation for a new manual wheelchair.    OBJECTIVE IMPAIRMENTS Abnormal gait, decreased activity tolerance, decreased balance, decreased cognition, decreased coordination, decreased endurance, decreased knowledge of condition, decreased knowledge of use of DME, decreased mobility, difficulty walking, decreased ROM, decreased strength, decreased safety awareness, impaired tone, impaired UE functional use, improper body mechanics, and postural dysfunction.   ACTIVITY LIMITATIONS carrying, lifting, bending, standing, squatting, stairs, transfers, continence, and locomotion level  PARTICIPATION LIMITATIONS: meal prep, cleaning, laundry, medication management, personal finances, interpersonal relationship, driving, shopping, community activity, occupation, and school  PERSONAL FACTORS Behavior pattern, Education, Fitness, Time since onset of injury/illness/exacerbation, and 1 comorbidity: spastic quadriplegic Cerebral Palsy are also affecting patient's functional outcome.   REHAB POTENTIAL: Good  CLINICAL DECISION MAKING:  Stable/uncomplicated  EVALUATION COMPLEXITY: High                                   GOALS: One time visit. No goals established.    PLAN: PT FREQUENCY: one time visit    Daved KATHEE Bull, PT, DPT 08/23/2024, 11:41 AM    I concur with the above findings and recommendations of the therapist:  Physician name printed:         Physician's signature:      Date:

## 2024-10-16 ENCOUNTER — Other Ambulatory Visit (INDEPENDENT_AMBULATORY_CARE_PROVIDER_SITE_OTHER): Payer: Self-pay | Admitting: Family

## 2024-10-16 DIAGNOSIS — R451 Restlessness and agitation: Secondary | ICD-10-CM

## 2024-12-24 DIAGNOSIS — R451 Restlessness and agitation: Secondary | ICD-10-CM

## 2024-12-24 DIAGNOSIS — G40309 Generalized idiopathic epilepsy and epileptic syndromes, not intractable, without status epilepticus: Secondary | ICD-10-CM

## 2024-12-24 NOTE — Telephone Encounter (Signed)
 Apt scheduled.

## 2024-12-26 ENCOUNTER — Telehealth (INDEPENDENT_AMBULATORY_CARE_PROVIDER_SITE_OTHER): Payer: Self-pay | Admitting: Family

## 2024-12-26 NOTE — Telephone Encounter (Signed)
 Refill request sent to provider for approval in refill encounter from 1.6.2026.  SS, CCMA

## 2024-12-26 NOTE — Telephone Encounter (Signed)
"  °  Name of who is calling: Joretta  Caller's Relationship to Patient: mom  Best contact number: [phone]  Provider they see: [facility]  Reason for call: mom stated she was able to get rx because they needed to be seen first. She has been scheduled for 2/2 but says she needs authorization for a 30 day refill for seizure medication. Request call back      PRESCRIPTION REFILL ONLY  Name of prescription:  Pharmacy:  "

## 2025-01-20 ENCOUNTER — Telehealth (INDEPENDENT_AMBULATORY_CARE_PROVIDER_SITE_OTHER): Admitting: Family

## 2025-01-21 ENCOUNTER — Telehealth: Payer: Self-pay | Admitting: Family Medicine

## 2025-01-21 NOTE — Telephone Encounter (Signed)
 Copied from CRM [id number]. Topic: Appointments - Scheduling Inquiry for Clinic >> Jan 21, 2025  3:03 PM Roselie BROCKS wrote: Reason for CRM: Patients mom states Dr Randol is accepting new patients of existing patients but will not allow me schedule please reach out concerning this to schedule.
